# Patient Record
Sex: Female | Born: 1950
Health system: Southern US, Community
[De-identification: ages and names within clinical notes are randomized; demographics above are authoritative.]

## PROBLEM LIST (undated history)

## (undated) HISTORY — PX: FOOT SURGERY: SHX648

## (undated) HISTORY — PX: TONSILLECTOMY: SUR1361

## (undated) HISTORY — PX: APPENDECTOMY: SHX54

---

## 2002-12-05 ENCOUNTER — Other Ambulatory Visit: Admission: RE | Admit: 2002-12-05 | Discharge: 2002-12-05 | Payer: Self-pay | Admitting: Obstetrics and Gynecology

## 2004-07-14 ENCOUNTER — Other Ambulatory Visit: Admission: RE | Admit: 2004-07-14 | Discharge: 2004-07-14 | Payer: Self-pay | Admitting: Obstetrics and Gynecology

## 2009-08-16 ENCOUNTER — Ambulatory Visit: Payer: Self-pay | Admitting: Diagnostic Radiology

## 2009-08-16 ENCOUNTER — Ambulatory Visit (HOSPITAL_BASED_OUTPATIENT_CLINIC_OR_DEPARTMENT_OTHER): Admission: RE | Admit: 2009-08-16 | Discharge: 2009-08-16 | Payer: Self-pay | Admitting: Obstetrics and Gynecology

## 2011-02-25 ENCOUNTER — Other Ambulatory Visit (HOSPITAL_BASED_OUTPATIENT_CLINIC_OR_DEPARTMENT_OTHER): Payer: Self-pay | Admitting: Obstetrics and Gynecology

## 2011-02-25 ENCOUNTER — Other Ambulatory Visit (HOSPITAL_COMMUNITY): Payer: Self-pay | Admitting: Obstetrics and Gynecology

## 2011-02-25 DIAGNOSIS — Z1231 Encounter for screening mammogram for malignant neoplasm of breast: Secondary | ICD-10-CM

## 2011-02-26 ENCOUNTER — Ambulatory Visit (HOSPITAL_BASED_OUTPATIENT_CLINIC_OR_DEPARTMENT_OTHER)
Admission: RE | Admit: 2011-02-26 | Discharge: 2011-02-26 | Disposition: A | Discharge status: Home / Self Care | Payer: BC Managed Care – PPO | Source: Ambulatory Visit | Attending: Obstetrics and Gynecology | Admitting: Obstetrics and Gynecology

## 2011-02-26 DIAGNOSIS — Z1231 Encounter for screening mammogram for malignant neoplasm of breast: Secondary | ICD-10-CM | POA: Insufficient documentation

## 2012-01-20 ENCOUNTER — Encounter (INDEPENDENT_AMBULATORY_CARE_PROVIDER_SITE_OTHER): Payer: BC Managed Care – PPO | Admitting: Ophthalmology

## 2012-01-20 DIAGNOSIS — H33309 Unspecified retinal break, unspecified eye: Secondary | ICD-10-CM

## 2012-01-20 DIAGNOSIS — H2510 Age-related nuclear cataract, unspecified eye: Secondary | ICD-10-CM

## 2012-01-20 DIAGNOSIS — H43819 Vitreous degeneration, unspecified eye: Secondary | ICD-10-CM

## 2012-02-03 ENCOUNTER — Ambulatory Visit (INDEPENDENT_AMBULATORY_CARE_PROVIDER_SITE_OTHER): Payer: BC Managed Care – PPO | Admitting: Ophthalmology

## 2012-02-03 DIAGNOSIS — H33309 Unspecified retinal break, unspecified eye: Secondary | ICD-10-CM

## 2012-02-03 DIAGNOSIS — H40019 Open angle with borderline findings, low risk, unspecified eye: Secondary | ICD-10-CM

## 2012-03-25 ENCOUNTER — Other Ambulatory Visit (HOSPITAL_BASED_OUTPATIENT_CLINIC_OR_DEPARTMENT_OTHER): Payer: Self-pay | Admitting: Obstetrics and Gynecology

## 2012-03-25 DIAGNOSIS — Z1231 Encounter for screening mammogram for malignant neoplasm of breast: Secondary | ICD-10-CM

## 2012-04-04 ENCOUNTER — Ambulatory Visit (HOSPITAL_BASED_OUTPATIENT_CLINIC_OR_DEPARTMENT_OTHER)
Admission: RE | Admit: 2012-04-04 | Discharge: 2012-04-04 | Disposition: A | Discharge status: Home / Self Care | Payer: BC Managed Care – PPO | Source: Ambulatory Visit | Attending: Obstetrics and Gynecology | Admitting: Obstetrics and Gynecology

## 2012-04-04 DIAGNOSIS — Z1231 Encounter for screening mammogram for malignant neoplasm of breast: Secondary | ICD-10-CM | POA: Insufficient documentation

## 2012-06-08 ENCOUNTER — Ambulatory Visit (INDEPENDENT_AMBULATORY_CARE_PROVIDER_SITE_OTHER): Payer: BC Managed Care – PPO | Admitting: Ophthalmology

## 2012-06-29 ENCOUNTER — Ambulatory Visit (INDEPENDENT_AMBULATORY_CARE_PROVIDER_SITE_OTHER): Payer: BC Managed Care – PPO | Admitting: Ophthalmology

## 2012-07-25 ENCOUNTER — Ambulatory Visit (INDEPENDENT_AMBULATORY_CARE_PROVIDER_SITE_OTHER): Payer: BC Managed Care – PPO | Admitting: Ophthalmology

## 2012-07-25 DIAGNOSIS — H2510 Age-related nuclear cataract, unspecified eye: Secondary | ICD-10-CM

## 2012-07-25 DIAGNOSIS — H33309 Unspecified retinal break, unspecified eye: Secondary | ICD-10-CM

## 2012-07-25 DIAGNOSIS — H43819 Vitreous degeneration, unspecified eye: Secondary | ICD-10-CM

## 2012-09-15 ENCOUNTER — Encounter (HOSPITAL_BASED_OUTPATIENT_CLINIC_OR_DEPARTMENT_OTHER): Payer: Self-pay | Admitting: *Deleted

## 2012-09-15 ENCOUNTER — Emergency Department (HOSPITAL_BASED_OUTPATIENT_CLINIC_OR_DEPARTMENT_OTHER): Payer: BC Managed Care – PPO

## 2012-09-15 ENCOUNTER — Emergency Department (HOSPITAL_BASED_OUTPATIENT_CLINIC_OR_DEPARTMENT_OTHER)
Admission: EM | Admit: 2012-09-15 | Discharge: 2012-09-15 | Disposition: A | Discharge status: Home / Self Care | Payer: BC Managed Care – PPO | Attending: Emergency Medicine | Admitting: Emergency Medicine

## 2012-09-15 DIAGNOSIS — M25561 Pain in right knee: Secondary | ICD-10-CM

## 2012-09-15 DIAGNOSIS — Y939 Activity, unspecified: Secondary | ICD-10-CM | POA: Insufficient documentation

## 2012-09-15 DIAGNOSIS — S8990XA Unspecified injury of unspecified lower leg, initial encounter: Secondary | ICD-10-CM | POA: Insufficient documentation

## 2012-09-15 DIAGNOSIS — Y929 Unspecified place or not applicable: Secondary | ICD-10-CM | POA: Insufficient documentation

## 2012-09-15 DIAGNOSIS — X500XXA Overexertion from strenuous movement or load, initial encounter: Secondary | ICD-10-CM | POA: Insufficient documentation

## 2012-09-15 DIAGNOSIS — S99919A Unspecified injury of unspecified ankle, initial encounter: Secondary | ICD-10-CM | POA: Insufficient documentation

## 2012-09-15 MED ORDER — HYDROMORPHONE HCL PF 1 MG/ML IJ SOLN: 1.0000 mg | Freq: Once | INTRAMUSCULAR | Status: DC

## 2012-09-15 MED ORDER — ONDANSETRON HCL 4 MG/2ML IJ SOLN: 4.0000 mg | Freq: Once | INTRAMUSCULAR | Status: DC

## 2012-09-15 NOTE — ED Provider Notes (Signed)
Medical screening examination/treatment/procedure(s) were performed by non-physician practitioner and as supervising physician I was immediately available for consultation/collaboration.   Anyelina Claycomb B. Celina Shiley, MD 09/15/12 1515 

## 2012-09-15 NOTE — ED Notes (Signed)
She thinks she dislocated her right knee. Hx of same several times over the past year.

## 2012-09-15 NOTE — ED Notes (Signed)
Pt ambulatory- declined pain medication and knee support- no rx given

## 2012-09-15 NOTE — ED Provider Notes (Signed)
History     CSN: [id number]  Arrival date & time 09/15/12  1338   First MD Initiated Contact with Patient 09/15/12 1348      Chief Complaint  Patient presents with  . Knee Pain    (Consider location/radiation/quality/duration/timing/severity/associated sxs/prior treatment) HPI Comments: Pt states that she has a history of a ligament popping out of place in her right knee and she can't straighten it when it happens:pt states that it popped out when she was doing piliates today:pt states that when it pops back in she is usually fine:pt denies swelling   Patient is a 62 y.o. female presenting with knee pain. The history is provided by the patient. No language interpreter was used.  Knee Pain Location:  Knee Injury: yes   Knee location:  R knee Pain details:    Quality:  Aching   Severity:  Severe   Onset quality:  Sudden   Timing:  Constant   History reviewed. No pertinent past medical history.  Past Surgical History  Procedure Laterality Date  . Foot surgery      No family history on file.  History  Substance Use Topics  . Smoking status: Never Smoker   . Smokeless tobacco: Not on file  . Alcohol Use: No    OB History   Grav Para Term Preterm Abortions TAB SAB Ect Mult Living                  Review of Systems  Constitutional: Negative.   Respiratory: Negative.   Cardiovascular: Negative.     Allergies  Codeine  Home Medications  No current outpatient prescriptions on file.  BP 150/73  Pulse 63  Temp(Src) 97.6 F (36.4 C) (Oral)  Resp 22  Wt 120 lb (54.432 kg)  SpO2 100%  Physical Exam  Nursing note and vitals reviewed. Constitutional: She is oriented to person, place, and time. She appears well-nourished.  HENT:  Head: Normocephalic and atraumatic.  Cardiovascular: Normal rate and regular rhythm.   Pulmonary/Chest: Effort normal and breath sounds normal.  Musculoskeletal:  Pt is unable to fully straighten the right knee:no gross deformity  noted  Neurological: She is alert and oriented to person, place, and time.  Skin: Skin is warm and dry.  Psychiatric: She has a normal mood and affect.    ED Course  Procedures (including critical care time)  Labs Reviewed - No data to display No results found.   1. Knee pain, right       MDM  Pt relocated her knee on her own and is able to bend and flex knee without any problem:pt is refusing x-ray or immobilization:pt ambulation without any problem        Teressa Lower, NP 09/15/12 1414

## 2012-12-27 ENCOUNTER — Encounter: Payer: Self-pay | Admitting: Gastroenterology

## 2013-01-25 ENCOUNTER — Encounter: Payer: Self-pay | Admitting: Gastroenterology

## 2013-01-25 ENCOUNTER — Ambulatory Visit (INDEPENDENT_AMBULATORY_CARE_PROVIDER_SITE_OTHER): Payer: BC Managed Care – PPO | Admitting: Gastroenterology

## 2013-01-25 VITALS — BP 110/70 | HR 60 | Ht 62.0 in | Wt 120.2 lb

## 2013-01-25 DIAGNOSIS — K5900 Constipation, unspecified: Secondary | ICD-10-CM

## 2013-01-25 DIAGNOSIS — R195 Other fecal abnormalities: Secondary | ICD-10-CM

## 2013-01-25 MED ORDER — MOVIPREP 100 G PO SOLR: 1.0000 | Freq: Once | ORAL | Status: DC

## 2013-01-25 NOTE — Patient Instructions (Addendum)
Please start taking citrucel (orange flavored) powder fiber supplement.  This may cause some bloating at first but that usually goes away. Begin with a small spoonful and work your way up to a large, heaping spoonful daily over a week. You will be set up for a colonoscopy for constipation, hemocult positive stool (LEC, moderate sedation).                                                We are excited to introduce MyChart, a new best-in-class service that provides you online access to important information in your electronic medical record. We want to make it easier for you to view your health information - all in one secure location - when and where you need it. We expect MyChart will enhance the quality of care and service we provide.  When you register for MyChart, you can:    View your test results.    Request appointments and receive appointment reminders via email.    Request medication renewals.    View your medical history, allergies, medications and immunizations.    Communicate with your physician's office through a password-protected site.    Conveniently print information such as your medication lists.  To find out if MyChart is right for you, please talk to a member of our clinical staff today. We will gladly answer your questions about this free health and wellness tool.  If you are age 60 or older and want a member of your family to have access to your record, you must provide written consent by completing a proxy form available at our office. Please speak to our clinical staff about guidelines regarding accounts for patients younger than age 7.  As you activate your MyChart account and need any technical assistance, please call the MyChart technical support line at [phone]) or email your question to [email or web address]. If you email your question(s), please include your name, a return phone number and the best time to reach you.  If you have  non-urgent health-related questions, you can send a message to our office through MyChart at [email or web address]. If you have a medical emergency, call 911.  Thank you for using MyChart as your new health and wellness resource!   MyChart licensed from [employer],  4401-0272. Patents Pending.

## 2013-01-25 NOTE — Progress Notes (Signed)
  HPI: This is a  very pleasant 62 year old woman whom I am meeting for the first time today.  Microscopic blood in stool on home kit for fobt.  This was done at her primary care office as part of a routine physical  She has constipation, only once has she seen blood in stool. This has been a problem all her life. She takes miralax, prunes.  Stay hydrated.  Has never had a colonoscopy.  A friend's husband had perforation from colonoscopy.     Review of systems: Pertinent positive and negative review of systems were noted in the above HPI section. Complete review of systems was performed and was otherwise normal.    History reviewed. No pertinent past medical history.  Past Surgical History  Procedure Laterality Date  . Foot surgery Bilateral   . Tonsillectomy      age 70  . Appendectomy      age 41    Current Outpatient Prescriptions  Medication Sig Dispense Refill  . estradiol (ESTRACE) 0.5 MG tablet Take 0.5 mg by mouth every other day.      . fish oil-omega-3 fatty acids 1000 MG capsule Take 2 g by mouth daily.      Marland Kitchen ibuprofen (ADVIL,MOTRIN) 200 MG tablet Take 400 mg by mouth as needed for pain.      Marland Kitchen MULTIPLE VITAMIN PO Take 1 tablet by mouth daily.      . Polyethylene Glycol 3350 (MIRALAX PO) Take by mouth daily.       No current facility-administered medications for this visit.    Allergies as of 01/25/2013 - Review Complete 01/25/2013  Allergen Reaction Noted  . Codeine  09/15/2012    Family History  Problem Relation Age of Onset  . Colon cancer Neg Hx   . Congestive Heart Failure Father   . Other Mother     brain tumors-benign    History   Social History  . Marital Status: Married    Spouse Name: N/A    Number of Children: 2  . Years of Education: N/A   Occupational History  . water aerobics teacher    Social History Main Topics  . Smoking status: Never Smoker   . Smokeless tobacco: Never Used  . Alcohol Use: No  . Drug Use: No  . Sexually  Active: Not on file   Other Topics Concern  . Not on file   Social History Narrative  . No narrative on file       Physical Exam: BP 110/70  Pulse 60  Ht 5\' 2"  (1.575 m)  Wt 120 lb 3.2 oz (54.522 kg)  BMI 21.98 kg/m2 Constitutional: generally well-appearing Psychiatric: alert and oriented x3 Eyes: extraocular movements intact Mouth: oral pharynx moist, no lesions Neck: supple no lymphadenopathy Cardiovascular: heart regular rate and rhythm Lungs: clear to auscultation bilaterally Abdomen: soft, nontender, nondistended, no obvious ascites, no peritoneal signs, normal bowel sounds Extremities: no lower extremity edema bilaterally Skin: no lesions on visible extremities    Assessment and plan: 62 y.o. female with  Hemoccult-positive stool, chronic constipation  She has had lifelong constipation. I recommended she add fiber supplements to her daily MiraLax routine. I reassured her that her Hemoccult-positive stool is unlikely to be anything serious we should proceed with colonoscopy at her soonest convenience to be more certain.

## 2013-02-07 ENCOUNTER — Ambulatory Visit (AMBULATORY_SURGERY_CENTER): Payer: BC Managed Care – PPO | Admitting: Gastroenterology

## 2013-02-07 ENCOUNTER — Encounter: Payer: Self-pay | Admitting: Gastroenterology

## 2013-02-07 VITALS — BP 104/66 | HR 62 | Temp 98.3°F | Resp 23 | Ht 62.0 in | Wt 120.0 lb

## 2013-02-07 DIAGNOSIS — D126 Benign neoplasm of colon, unspecified: Secondary | ICD-10-CM

## 2013-02-07 DIAGNOSIS — K5900 Constipation, unspecified: Secondary | ICD-10-CM

## 2013-02-07 MED ORDER — SODIUM CHLORIDE 0.9 % IV SOLN: 500.0000 mL | INTRAVENOUS | Status: DC

## 2013-02-07 NOTE — Patient Instructions (Addendum)
YOU HAD AN ENDOSCOPIC PROCEDURE TODAY AT THE [facility]: Refer to the procedure report that was given to you for any specific questions about what was found during the examination.  If the procedure report does not answer your questions, please call your gastroenterologist to clarify.  If you requested that your care partner not be given the details of your procedure findings, then the procedure report has been included in a sealed envelope for you to review at your convenience later.  YOU SHOULD EXPECT: Some feelings of bloating in the abdomen. Passage of more gas than usual.  Walking can help get rid of the air that was put into your GI tract during the procedure and reduce the bloating. If you had a lower endoscopy (such as a colonoscopy or flexible sigmoidoscopy) you may notice spotting of blood in your stool or on the toilet paper. If you underwent a bowel prep for your procedure, then you may not have a normal bowel movement for a few days.  DIET: Your first meal following the procedure should be a light meal and then it is ok to progress to your normal diet.  A half-sandwich or bowl of soup is an example of a good first meal.  Heavy or fried foods are harder to digest and may make you feel nauseous or bloated.  Likewise meals heavy in dairy and vegetables can cause extra gas to form and this can also increase the bloating.  Drink plenty of fluids but you should avoid alcoholic beverages for 24 hours.  ACTIVITY: Your care partner should take you home directly after the procedure.  You should plan to take it easy, moving slowly for the rest of the day.  You can resume normal activity the day after the procedure however you should NOT DRIVE or use heavy machinery for 24 hours (because of the sedation medicines used during the test).    SYMPTOMS TO REPORT IMMEDIATELY: A gastroenterologist can be reached at any hour.  During normal business hours, 8:30 AM to 5:00 PM Monday through Friday,  call [phone].  After hours and on weekends, please call the GI answering service at [phone] emergency number who will take a message and have the physician on call contact you.   Following lower endoscopy (colonoscopy or flexible sigmoidoscopy):  Excessive amounts of blood in the stool  Significant tenderness or worsening of abdominal pains  Swelling of the abdomen that is new, acute  Fever of 100F or higher  FOLLOW UP: If any biopsies were taken you will be contacted by phone or by letter within the next 1-3 weeks.  Call your gastroenterologist if you have not heard about the biopsies in 3 weeks.  Our staff will call the home number listed on your records the next business day following your procedure to check on you and address any questions or concerns that you may have at that time regarding the information given to you following your procedure. This is a courtesy call and so if there is no answer at the home number and we have not heard from you through the emergency physician on call, we will assume that you have returned to your regular daily activities without incident.  SIGNATURES/CONFIDENTIALITY: You and/or your care partner have signed paperwork which will be entered into your electronic medical record.  These signatures attest to the fact that that the information above on your After Visit Summary has been reviewed and is understood.  Full responsibility of the confidentiality   of this discharge information lies with you and/or your care-partner.  Handout on polyps  

## 2013-02-07 NOTE — Progress Notes (Signed)
Patient did not experience any of the following events: a burn prior to discharge; a fall within the facility; wrong site/side/patient/procedure/implant event; or a hospital transfer or hospital admission upon discharge from the facility. (G8907)Patient did not have preoperative order for IV antibiotic SSI prophylaxis. (G8918) ewm 

## 2013-02-07 NOTE — Op Note (Signed)
[facility] [address] [place], [address]   COLONOSCOPY PROCEDURE REPORT  PATIENT: Kayla [name], Kayla [name]  MR#: [id number] BIRTHDATE: September 22, 1950 , 61  yrs. old GENDER: Female ENDOSCOPIST: Rachael Fee, MD REFERRED XB:JYNWG Strickland, M.D. PROCEDURE DATE:  02/07/2013 PROCEDURE:   Colonoscopy with snare polypectomy ASA CLASS:   Class II INDICATIONS:constipation, FOBT + stool. MEDICATIONS: Fentanyl 50 mcg IV, Versed 4 mg IV, and These medications were titrated to patient response per physician's verbal order  DESCRIPTION OF PROCEDURE:   After the risks benefits and alternatives of the procedure were thoroughly explained, informed consent was obtained.  A digital rectal exam revealed no abnormalities of the rectum.   The LB [id number]  endoscope was introduced through the anus and advanced to the cecum, which was identified by both the appendix and ileocecal valve. No adverse events experienced.   The quality of the prep was good.  The instrument was then slowly withdrawn as the colon was fully examined.   COLON FINDINGS: One polyp was found, removed and sent to pathology. This was 2-58mm across, sessile, located in descending segment, removed with cold snare.  The examination was otherwise normal. Retroflexed views revealed no abnormalities. The time to cecum=5 minutes 12 seconds.  Withdrawal time=8 minutes 56 seconds.  The scope was withdrawn and the procedure completed. COMPLICATIONS: There were no complications.  ENDOSCOPIC IMPRESSION: One polyp was found, removed and sent to pathology. The examination was otherwise normal.  RECOMMENDATIONS: If the polyp(s) removed today are proven to be adenomatous (pre-cancerous) polyps, you will need a repeat colonoscopy in 5 years.  Otherwise you should continue to follow colorectal cancer screening guidelines for "routine risk" patients with colonoscopy in 10 years.  You will receive a letter within 1-2 weeks  with the results of your biopsy as well as final recommendations.  Please call my office if you have not received a letter after 3 weeks.   eSigned:  Rachael Fee, MD 02/07/2013 8:41 AM

## 2013-02-07 NOTE — Progress Notes (Signed)
Pt's sats droppped to 87%.  Pt encourged to take  Deep breaths.  O2 bumped up to 4 liters [place].  sats increased to 975. Maw

## 2013-02-08 ENCOUNTER — Telehealth: Payer: Self-pay

## 2013-02-08 NOTE — Telephone Encounter (Signed)
  Follow up Call-  Call back number 02/07/2013  Post procedure Call Back phone  # [phone]  Permission to leave phone message Yes     Patient questions:  Do you have a fever, pain , or abdominal swelling? no Pain Score  0 *  Have you tolerated food without any problems? yes  Have you been able to return to your normal activities? yes  Do you have any questions about your discharge instructions: Diet   no Medications  no Follow up visit  no  Do you have questions or concerns about your Care? no  Actions: * If pain score is 4 or above: No action needed, pain <4.

## 2013-02-16 ENCOUNTER — Encounter: Payer: Self-pay | Admitting: Gastroenterology

## 2013-04-27 ENCOUNTER — Other Ambulatory Visit: Payer: Self-pay

## 2013-10-09 ENCOUNTER — Other Ambulatory Visit (HOSPITAL_BASED_OUTPATIENT_CLINIC_OR_DEPARTMENT_OTHER): Payer: Self-pay | Admitting: Obstetrics and Gynecology

## 2013-10-09 DIAGNOSIS — Z1231 Encounter for screening mammogram for malignant neoplasm of breast: Secondary | ICD-10-CM

## 2013-10-13 ENCOUNTER — Ambulatory Visit (HOSPITAL_BASED_OUTPATIENT_CLINIC_OR_DEPARTMENT_OTHER)
Admission: RE | Admit: 2013-10-13 | Discharge: 2013-10-13 | Disposition: A | Discharge status: Home / Self Care | Payer: BC Managed Care – PPO | Source: Ambulatory Visit | Attending: Obstetrics and Gynecology | Admitting: Obstetrics and Gynecology

## 2013-10-13 DIAGNOSIS — Z1231 Encounter for screening mammogram for malignant neoplasm of breast: Secondary | ICD-10-CM | POA: Insufficient documentation

## 2014-03-10 DIAGNOSIS — N951 Menopausal and female climacteric states: Secondary | ICD-10-CM | POA: Insufficient documentation

## 2014-11-26 ENCOUNTER — Other Ambulatory Visit (HOSPITAL_BASED_OUTPATIENT_CLINIC_OR_DEPARTMENT_OTHER): Payer: Self-pay | Admitting: Obstetrics and Gynecology

## 2014-11-26 DIAGNOSIS — Z1231 Encounter for screening mammogram for malignant neoplasm of breast: Secondary | ICD-10-CM

## 2014-11-27 ENCOUNTER — Ambulatory Visit (HOSPITAL_BASED_OUTPATIENT_CLINIC_OR_DEPARTMENT_OTHER)
Admission: RE | Admit: 2014-11-27 | Discharge: 2014-11-27 | Disposition: A | Discharge status: Home / Self Care | Payer: BLUE CROSS/BLUE SHIELD | Source: Ambulatory Visit | Attending: Obstetrics and Gynecology | Admitting: Obstetrics and Gynecology

## 2014-11-27 DIAGNOSIS — Z1231 Encounter for screening mammogram for malignant neoplasm of breast: Secondary | ICD-10-CM | POA: Insufficient documentation

## 2014-12-03 ENCOUNTER — Encounter (INDEPENDENT_AMBULATORY_CARE_PROVIDER_SITE_OTHER): Payer: BLUE CROSS/BLUE SHIELD | Admitting: Ophthalmology

## 2014-12-03 DIAGNOSIS — H33302 Unspecified retinal break, left eye: Secondary | ICD-10-CM

## 2014-12-03 DIAGNOSIS — H2512 Age-related nuclear cataract, left eye: Secondary | ICD-10-CM | POA: Diagnosis not present

## 2014-12-03 DIAGNOSIS — H43813 Vitreous degeneration, bilateral: Secondary | ICD-10-CM | POA: Diagnosis not present

## 2014-12-03 DIAGNOSIS — H34812 Central retinal vein occlusion, left eye: Secondary | ICD-10-CM

## 2014-12-17 ENCOUNTER — Encounter (INDEPENDENT_AMBULATORY_CARE_PROVIDER_SITE_OTHER): Payer: BLUE CROSS/BLUE SHIELD | Admitting: Ophthalmology

## 2014-12-17 DIAGNOSIS — H34812 Central retinal vein occlusion, left eye: Secondary | ICD-10-CM | POA: Diagnosis not present

## 2014-12-17 DIAGNOSIS — H33302 Unspecified retinal break, left eye: Secondary | ICD-10-CM | POA: Diagnosis not present

## 2014-12-17 DIAGNOSIS — H43813 Vitreous degeneration, bilateral: Secondary | ICD-10-CM

## 2014-12-26 ENCOUNTER — Ambulatory Visit (INDEPENDENT_AMBULATORY_CARE_PROVIDER_SITE_OTHER): Payer: BLUE CROSS/BLUE SHIELD

## 2014-12-26 ENCOUNTER — Ambulatory Visit: Payer: BLUE CROSS/BLUE SHIELD

## 2014-12-26 ENCOUNTER — Ambulatory Visit (INDEPENDENT_AMBULATORY_CARE_PROVIDER_SITE_OTHER): Payer: BLUE CROSS/BLUE SHIELD | Admitting: Podiatry

## 2014-12-26 VITALS — BP 137/72 | HR 70 | Resp 15

## 2014-12-26 DIAGNOSIS — M21611 Bunion of right foot: Secondary | ICD-10-CM

## 2014-12-26 DIAGNOSIS — M2012 Hallux valgus (acquired), left foot: Secondary | ICD-10-CM

## 2014-12-26 DIAGNOSIS — M2011 Hallux valgus (acquired), right foot: Secondary | ICD-10-CM

## 2014-12-26 DIAGNOSIS — M779 Enthesopathy, unspecified: Secondary | ICD-10-CM

## 2014-12-26 MED ORDER — TRIAMCINOLONE ACETONIDE 10 MG/ML IJ SUSP
10.0000 mg | Freq: Once | INTRAMUSCULAR | Status: AC
  Administered 2014-12-26: 10 mg

## 2014-12-26 NOTE — Progress Notes (Signed)
   Subjective:    Patient ID: Kayla [name], female    DOB: 04/28/1951, 64 y.o.   MRN: [id number]  HPI Patient present with a bunion on right foot, medial side and pain has gotten worse. On the tip of the 4th toe on the right foot, pt has no feeling. This has been going on for approximately the past 10 years from previous bunionectomy surgery. Pt was diagnosed in April 2016 from Dr. Burt Ek from the city of [place] with having plantar fasciitis. Pt is still having heel pain and stated "it feels bruised". Pt has been soaking foot in hot tub with relief. Pt has also been taking ibuprofen with relief.   Review of Systems  Eyes: Positive for visual disturbance.  Musculoskeletal: Positive for myalgias.  All other systems reviewed and are negative.      Objective:   Physical Exam        Assessment & Plan:

## 2014-12-28 NOTE — Progress Notes (Signed)
Subjective:     Patient ID: Kayla [name], female   DOB: Mar 22, 1951, 64 y.o.   MRN: [id number]  HPI patient presents stating that I had surgery by you around 10 years ago and I'm getting some discomfort on the medial side of my right first metatarsal and my fourth toe and also my heel over the last 6 months   Review of Systems  All other systems reviewed and are negative.      Objective:   Physical Exam  Constitutional: She is oriented to person, place, and time.  Cardiovascular: Intact distal pulses.   Musculoskeletal: Normal range of motion.  Neurological: She is oriented to person, place, and time.  Skin: Skin is warm.  Nursing note and vitals reviewed.  neurovascular status intact muscle strength adequate with range of motion of the subtalar midtarsal joint within normal limits. Patient has quite a bit of forefoot flexibility with depression of the arch upon weightbearing and is noted to have well-healed surgical scars first metatarsal right with excellent range of motion of the first MPJ bilateral. I did note that the patient has inflammation in the plantar medial portion of the first metatarsal with fluid buildup and also moderate discomfort in the plantar heel and arch right     Assessment:     Appears to be more inflammatory with inflamed capsule and does not appear to be structural with significant depression of the arch as part of the pathological process    Plan:     H&P and x-rays reviewed and careful medial injection administered 3 mg Kenalog 5 mg Xylocaine first MPJ. I then went ahead and I scanned for [facility] type orthotic to give lift into the arch and provide for proper heel cup and reduction of pressure. Patient will be seen back when those are returned

## 2015-01-08 ENCOUNTER — Encounter (INDEPENDENT_AMBULATORY_CARE_PROVIDER_SITE_OTHER): Payer: BLUE CROSS/BLUE SHIELD | Admitting: Ophthalmology

## 2015-01-08 DIAGNOSIS — H43813 Vitreous degeneration, bilateral: Secondary | ICD-10-CM | POA: Diagnosis not present

## 2015-01-08 DIAGNOSIS — H34812 Central retinal vein occlusion, left eye: Secondary | ICD-10-CM

## 2015-01-08 DIAGNOSIS — H33302 Unspecified retinal break, left eye: Secondary | ICD-10-CM

## 2015-01-15 ENCOUNTER — Encounter (INDEPENDENT_AMBULATORY_CARE_PROVIDER_SITE_OTHER): Payer: BLUE CROSS/BLUE SHIELD | Admitting: Ophthalmology

## 2015-01-15 DIAGNOSIS — H34831 Tributary (branch) retinal vein occlusion, right eye: Secondary | ICD-10-CM | POA: Diagnosis not present

## 2015-01-15 DIAGNOSIS — H33303 Unspecified retinal break, bilateral: Secondary | ICD-10-CM

## 2015-01-15 DIAGNOSIS — H43813 Vitreous degeneration, bilateral: Secondary | ICD-10-CM

## 2015-01-18 ENCOUNTER — Ambulatory Visit: Payer: BLUE CROSS/BLUE SHIELD | Admitting: *Deleted

## 2015-01-18 DIAGNOSIS — M779 Enthesopathy, unspecified: Secondary | ICD-10-CM

## 2015-01-18 NOTE — Patient Instructions (Signed)

## 2015-01-18 NOTE — Progress Notes (Signed)
Patient ID: Kayla [name], female   DOB: 21-Jan-1951, 64 y.o.   MRN: [id number] Patient presents for orthotic pick up.  Verbal and written break in and wear instructions given.  Patient will follow up in 4 weeks if symptoms worsen or fail to improve.

## 2015-02-01 ENCOUNTER — Telehealth: Payer: Self-pay | Admitting: *Deleted

## 2015-02-01 NOTE — Telephone Encounter (Signed)
Pt states she received her orthotic 2 weeks ago, and they seem to be cutting in to the arches of the feet especially the left.

## 2015-02-01 NOTE — Telephone Encounter (Signed)
Left message for patient to call me back regarding issues on 01/30/15.  Will call her again

## 2015-02-04 DIAGNOSIS — H348122 Central retinal vein occlusion, left eye, stable: Secondary | ICD-10-CM | POA: Insufficient documentation

## 2015-02-05 ENCOUNTER — Encounter (INDEPENDENT_AMBULATORY_CARE_PROVIDER_SITE_OTHER): Payer: BLUE CROSS/BLUE SHIELD | Admitting: Ophthalmology

## 2015-02-05 DIAGNOSIS — H34812 Central retinal vein occlusion, left eye: Secondary | ICD-10-CM

## 2015-02-05 DIAGNOSIS — H43813 Vitreous degeneration, bilateral: Secondary | ICD-10-CM

## 2015-02-05 DIAGNOSIS — H33302 Unspecified retinal break, left eye: Secondary | ICD-10-CM

## 2015-02-28 ENCOUNTER — Telehealth: Payer: Self-pay | Admitting: *Deleted

## 2015-02-28 NOTE — Telephone Encounter (Signed)
Pt states she was here 3.5 weeks ago and her orthotics were painful and were sent back for changes.  She is calling to check the status of the repairs.

## 2015-03-04 ENCOUNTER — Encounter (INDEPENDENT_AMBULATORY_CARE_PROVIDER_SITE_OTHER): Payer: BLUE CROSS/BLUE SHIELD | Admitting: Ophthalmology

## 2015-03-04 DIAGNOSIS — H34812 Central retinal vein occlusion, left eye: Secondary | ICD-10-CM | POA: Diagnosis not present

## 2015-03-04 DIAGNOSIS — H43813 Vitreous degeneration, bilateral: Secondary | ICD-10-CM | POA: Diagnosis not present

## 2015-03-04 DIAGNOSIS — H2512 Age-related nuclear cataract, left eye: Secondary | ICD-10-CM | POA: Diagnosis not present

## 2015-03-04 DIAGNOSIS — H33302 Unspecified retinal break, left eye: Secondary | ICD-10-CM

## 2015-03-08 ENCOUNTER — Ambulatory Visit: Payer: BLUE CROSS/BLUE SHIELD | Admitting: *Deleted

## 2015-03-08 DIAGNOSIS — M779 Enthesopathy, unspecified: Secondary | ICD-10-CM

## 2015-03-08 NOTE — Progress Notes (Signed)
Patient ID: Kayla [name], female   DOB: [birth year], 64 y.o.   MRN: [id number] Pick up orthotics.  Orthotics are tried on and are still cutting into arch will send back for further adjustment.

## 2015-03-08 NOTE — Patient Instructions (Signed)

## 2015-04-01 ENCOUNTER — Encounter (INDEPENDENT_AMBULATORY_CARE_PROVIDER_SITE_OTHER): Payer: BLUE CROSS/BLUE SHIELD | Admitting: Ophthalmology

## 2015-04-03 ENCOUNTER — Encounter (INDEPENDENT_AMBULATORY_CARE_PROVIDER_SITE_OTHER): Payer: BLUE CROSS/BLUE SHIELD | Admitting: Ophthalmology

## 2015-04-03 DIAGNOSIS — H348321 Tributary (branch) retinal vein occlusion, left eye, with retinal neovascularization: Secondary | ICD-10-CM

## 2015-04-03 DIAGNOSIS — H33302 Unspecified retinal break, left eye: Secondary | ICD-10-CM | POA: Diagnosis not present

## 2015-04-03 DIAGNOSIS — H2512 Age-related nuclear cataract, left eye: Secondary | ICD-10-CM

## 2015-04-03 DIAGNOSIS — H3509 Other intraretinal microvascular abnormalities: Secondary | ICD-10-CM

## 2015-04-03 DIAGNOSIS — H43813 Vitreous degeneration, bilateral: Secondary | ICD-10-CM

## 2015-05-07 ENCOUNTER — Encounter (INDEPENDENT_AMBULATORY_CARE_PROVIDER_SITE_OTHER): Payer: BLUE CROSS/BLUE SHIELD | Admitting: Ophthalmology

## 2015-05-07 DIAGNOSIS — H33302 Unspecified retinal break, left eye: Secondary | ICD-10-CM | POA: Diagnosis not present

## 2015-05-07 DIAGNOSIS — H348321 Tributary (branch) retinal vein occlusion, left eye, with retinal neovascularization: Secondary | ICD-10-CM

## 2015-05-07 DIAGNOSIS — H43813 Vitreous degeneration, bilateral: Secondary | ICD-10-CM | POA: Diagnosis not present

## 2015-05-20 ENCOUNTER — Ambulatory Visit (INDEPENDENT_AMBULATORY_CARE_PROVIDER_SITE_OTHER): Payer: BLUE CROSS/BLUE SHIELD | Admitting: Podiatry

## 2015-05-20 ENCOUNTER — Encounter: Payer: Self-pay | Admitting: Podiatry

## 2015-05-20 VITALS — BP 133/84 | HR 77 | Resp 12

## 2015-05-20 DIAGNOSIS — M779 Enthesopathy, unspecified: Secondary | ICD-10-CM

## 2015-05-22 NOTE — Progress Notes (Signed)
Subjective:     Patient ID: Kayla [name], female   DOB: [birth year], 64 y.o.   MRN: [id number]  HPI patient states that she's continuing to have problems with her orthotic and giving her leg problems and foot problems more right over left   Review of Systems     Objective:   Physical Exam Neurovascular status intact muscle strength adequate with moderate depression of the arch and continued discomfort in the arch and lower leg right over left with negative Homans sign noted    Assessment:     Tendinitis-like condition that so far is not reducing like we have been helping    Plan:     Reviewed condition and at this time we are going to try to again to revamp the orthotics. Reappoint to recheck

## 2015-06-03 ENCOUNTER — Encounter (INDEPENDENT_AMBULATORY_CARE_PROVIDER_SITE_OTHER): Payer: BLUE CROSS/BLUE SHIELD | Admitting: Ophthalmology

## 2015-06-04 ENCOUNTER — Encounter (INDEPENDENT_AMBULATORY_CARE_PROVIDER_SITE_OTHER): Payer: BLUE CROSS/BLUE SHIELD | Admitting: Ophthalmology

## 2015-06-04 DIAGNOSIS — H43813 Vitreous degeneration, bilateral: Secondary | ICD-10-CM

## 2015-06-04 DIAGNOSIS — H2512 Age-related nuclear cataract, left eye: Secondary | ICD-10-CM | POA: Diagnosis not present

## 2015-06-04 DIAGNOSIS — H34832 Tributary (branch) retinal vein occlusion, left eye, with macular edema: Secondary | ICD-10-CM

## 2015-07-09 ENCOUNTER — Encounter (INDEPENDENT_AMBULATORY_CARE_PROVIDER_SITE_OTHER): Payer: BLUE CROSS/BLUE SHIELD | Admitting: Ophthalmology

## 2015-07-09 DIAGNOSIS — H34832 Tributary (branch) retinal vein occlusion, left eye, with macular edema: Secondary | ICD-10-CM

## 2015-07-09 DIAGNOSIS — H43813 Vitreous degeneration, bilateral: Secondary | ICD-10-CM

## 2015-08-20 ENCOUNTER — Encounter (INDEPENDENT_AMBULATORY_CARE_PROVIDER_SITE_OTHER): Payer: BLUE CROSS/BLUE SHIELD | Admitting: Ophthalmology

## 2015-08-20 ENCOUNTER — Ambulatory Visit: Payer: BLUE CROSS/BLUE SHIELD | Admitting: *Deleted

## 2015-08-20 DIAGNOSIS — H34832 Tributary (branch) retinal vein occlusion, left eye, with macular edema: Secondary | ICD-10-CM | POA: Diagnosis not present

## 2015-08-20 DIAGNOSIS — H43813 Vitreous degeneration, bilateral: Secondary | ICD-10-CM

## 2015-08-20 DIAGNOSIS — H2513 Age-related nuclear cataract, bilateral: Secondary | ICD-10-CM

## 2015-08-20 DIAGNOSIS — M779 Enthesopathy, unspecified: Secondary | ICD-10-CM

## 2015-08-26 NOTE — Progress Notes (Signed)
Patient ID: Kayla [name], female   DOB: September 06, 1950, 65 y.o.   MRN: [id number] Patient presents stating that she feels like her left foot is rolling off the orthotic.  We will send orthotic to manufacturer to have a lateral wedge added to try and stop the rolling off.  We will call when the orthotics returns

## 2015-10-15 ENCOUNTER — Encounter (INDEPENDENT_AMBULATORY_CARE_PROVIDER_SITE_OTHER): Payer: BLUE CROSS/BLUE SHIELD | Admitting: Ophthalmology

## 2015-10-15 DIAGNOSIS — H43813 Vitreous degeneration, bilateral: Secondary | ICD-10-CM

## 2015-10-15 DIAGNOSIS — H34832 Tributary (branch) retinal vein occlusion, left eye, with macular edema: Secondary | ICD-10-CM

## 2015-10-15 DIAGNOSIS — H2513 Age-related nuclear cataract, bilateral: Secondary | ICD-10-CM

## 2015-12-12 ENCOUNTER — Ambulatory Visit
Admission: RE | Admit: 2015-12-12 | Discharge: 2015-12-12 | Disposition: A | Discharge status: Home / Self Care | Payer: BLUE CROSS/BLUE SHIELD | Source: Ambulatory Visit | Attending: Family Medicine | Admitting: Family Medicine

## 2015-12-12 ENCOUNTER — Other Ambulatory Visit: Payer: Self-pay | Admitting: Family Medicine

## 2015-12-12 DIAGNOSIS — R938 Abnormal findings on diagnostic imaging of other specified body structures: Secondary | ICD-10-CM | POA: Insufficient documentation

## 2015-12-12 DIAGNOSIS — T148XXA Other injury of unspecified body region, initial encounter: Secondary | ICD-10-CM

## 2015-12-12 DIAGNOSIS — M7981 Nontraumatic hematoma of soft tissue: Secondary | ICD-10-CM | POA: Insufficient documentation

## 2015-12-19 ENCOUNTER — Encounter (INDEPENDENT_AMBULATORY_CARE_PROVIDER_SITE_OTHER): Payer: BLUE CROSS/BLUE SHIELD | Admitting: Ophthalmology

## 2015-12-19 DIAGNOSIS — H43813 Vitreous degeneration, bilateral: Secondary | ICD-10-CM | POA: Diagnosis not present

## 2015-12-19 DIAGNOSIS — H2513 Age-related nuclear cataract, bilateral: Secondary | ICD-10-CM | POA: Diagnosis not present

## 2015-12-19 DIAGNOSIS — H34832 Tributary (branch) retinal vein occlusion, left eye, with macular edema: Secondary | ICD-10-CM | POA: Diagnosis not present

## 2015-12-31 ENCOUNTER — Encounter (INDEPENDENT_AMBULATORY_CARE_PROVIDER_SITE_OTHER): Payer: BLUE CROSS/BLUE SHIELD | Admitting: Ophthalmology

## 2016-01-20 ENCOUNTER — Other Ambulatory Visit (HOSPITAL_BASED_OUTPATIENT_CLINIC_OR_DEPARTMENT_OTHER): Payer: Self-pay | Admitting: Obstetrics and Gynecology

## 2016-01-20 DIAGNOSIS — Z1231 Encounter for screening mammogram for malignant neoplasm of breast: Secondary | ICD-10-CM

## 2016-01-21 ENCOUNTER — Ambulatory Visit (HOSPITAL_BASED_OUTPATIENT_CLINIC_OR_DEPARTMENT_OTHER)
Admission: RE | Admit: 2016-01-21 | Discharge: 2016-01-21 | Disposition: A | Discharge status: Home / Self Care | Payer: BLUE CROSS/BLUE SHIELD | Source: Ambulatory Visit | Attending: Obstetrics and Gynecology | Admitting: Obstetrics and Gynecology

## 2016-01-21 DIAGNOSIS — Z1231 Encounter for screening mammogram for malignant neoplasm of breast: Secondary | ICD-10-CM | POA: Diagnosis not present

## 2016-03-04 ENCOUNTER — Encounter (INDEPENDENT_AMBULATORY_CARE_PROVIDER_SITE_OTHER): Payer: BLUE CROSS/BLUE SHIELD | Admitting: Ophthalmology

## 2016-03-04 DIAGNOSIS — H348322 Tributary (branch) retinal vein occlusion, left eye, stable: Secondary | ICD-10-CM

## 2016-03-04 DIAGNOSIS — H43813 Vitreous degeneration, bilateral: Secondary | ICD-10-CM

## 2016-04-29 ENCOUNTER — Encounter (INDEPENDENT_AMBULATORY_CARE_PROVIDER_SITE_OTHER): Payer: BLUE CROSS/BLUE SHIELD | Admitting: Ophthalmology

## 2016-04-29 DIAGNOSIS — H34832 Tributary (branch) retinal vein occlusion, left eye, with macular edema: Secondary | ICD-10-CM | POA: Diagnosis not present

## 2016-04-29 DIAGNOSIS — H43813 Vitreous degeneration, bilateral: Secondary | ICD-10-CM | POA: Diagnosis not present

## 2016-04-29 DIAGNOSIS — H2513 Age-related nuclear cataract, bilateral: Secondary | ICD-10-CM | POA: Diagnosis not present

## 2016-07-22 ENCOUNTER — Encounter (INDEPENDENT_AMBULATORY_CARE_PROVIDER_SITE_OTHER): Payer: BLUE CROSS/BLUE SHIELD | Admitting: Ophthalmology

## 2016-07-22 DIAGNOSIS — H43813 Vitreous degeneration, bilateral: Secondary | ICD-10-CM | POA: Diagnosis not present

## 2016-07-22 DIAGNOSIS — H2513 Age-related nuclear cataract, bilateral: Secondary | ICD-10-CM | POA: Diagnosis not present

## 2016-07-22 DIAGNOSIS — H348322 Tributary (branch) retinal vein occlusion, left eye, stable: Secondary | ICD-10-CM | POA: Diagnosis not present

## 2016-09-16 ENCOUNTER — Encounter (INDEPENDENT_AMBULATORY_CARE_PROVIDER_SITE_OTHER): Payer: BLUE CROSS/BLUE SHIELD | Admitting: Ophthalmology

## 2016-09-16 DIAGNOSIS — H34832 Tributary (branch) retinal vein occlusion, left eye, with macular edema: Secondary | ICD-10-CM | POA: Diagnosis not present

## 2016-09-16 DIAGNOSIS — H43813 Vitreous degeneration, bilateral: Secondary | ICD-10-CM | POA: Diagnosis not present

## 2016-09-16 DIAGNOSIS — H2513 Age-related nuclear cataract, bilateral: Secondary | ICD-10-CM

## 2016-11-11 ENCOUNTER — Encounter (INDEPENDENT_AMBULATORY_CARE_PROVIDER_SITE_OTHER): Payer: BLUE CROSS/BLUE SHIELD | Admitting: Ophthalmology

## 2016-11-11 DIAGNOSIS — H43813 Vitreous degeneration, bilateral: Secondary | ICD-10-CM | POA: Diagnosis not present

## 2016-11-11 DIAGNOSIS — H348322 Tributary (branch) retinal vein occlusion, left eye, stable: Secondary | ICD-10-CM | POA: Diagnosis not present

## 2016-12-03 DIAGNOSIS — N952 Postmenopausal atrophic vaginitis: Secondary | ICD-10-CM | POA: Insufficient documentation

## 2017-01-06 ENCOUNTER — Encounter (INDEPENDENT_AMBULATORY_CARE_PROVIDER_SITE_OTHER): Payer: BLUE CROSS/BLUE SHIELD | Admitting: Ophthalmology

## 2017-01-06 DIAGNOSIS — H43813 Vitreous degeneration, bilateral: Secondary | ICD-10-CM

## 2017-01-06 DIAGNOSIS — H348322 Tributary (branch) retinal vein occlusion, left eye, stable: Secondary | ICD-10-CM

## 2017-02-01 ENCOUNTER — Other Ambulatory Visit (HOSPITAL_BASED_OUTPATIENT_CLINIC_OR_DEPARTMENT_OTHER): Payer: Self-pay | Admitting: Obstetrics and Gynecology

## 2017-02-01 DIAGNOSIS — Z1239 Encounter for other screening for malignant neoplasm of breast: Secondary | ICD-10-CM

## 2017-02-02 ENCOUNTER — Telehealth: Payer: Self-pay

## 2017-02-02 NOTE — Telephone Encounter (Signed)
SENT NOTES TO SCHEDULING 

## 2017-02-03 ENCOUNTER — Ambulatory Visit (INDEPENDENT_AMBULATORY_CARE_PROVIDER_SITE_OTHER): Payer: BLUE CROSS/BLUE SHIELD | Admitting: Cardiology

## 2017-02-03 ENCOUNTER — Encounter: Payer: Self-pay | Admitting: Cardiology

## 2017-02-03 VITALS — BP 114/76 | HR 83 | Ht 62.0 in | Wt 128.0 lb

## 2017-02-03 DIAGNOSIS — I447 Left bundle-branch block, unspecified: Secondary | ICD-10-CM | POA: Diagnosis not present

## 2017-02-03 DIAGNOSIS — D6851 Activated protein C resistance: Secondary | ICD-10-CM

## 2017-02-03 DIAGNOSIS — R002 Palpitations: Secondary | ICD-10-CM | POA: Diagnosis not present

## 2017-02-03 NOTE — Progress Notes (Signed)
Cardiology Office Note:    Date:  02/03/2017   ID:  Kayla [name], DOB 1951/06/12, MRN [id number]  PCP:  Karen Kitchens, MD  Cardiologist:  Jenean Lindau, MD   Referring MD: Karen Kitchens, MD    ASSESSMENT:    1. Palpitations   2. Factor V Leiden ([place])    PLAN:    In order of problems listed above:  1. I discussed my findings with the patient at extensive length. In view of the fact that the patient has new-onset bundle-branch block or at least newly diagnosed left bundle branch block I will do a Lexiscan sestamibi. 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 She vocalized understanding. Echocardiogram will be done to assess murmur heard on auscultation. Her blood pressure stable and lipids are followed by her primary care physician. She will be seen in follow-up appointment in 6 months or earlier if she has any concerns.   Medication Adjustments/Labs and Tests Ordered: Current medicines are reviewed at length with the patient today.  Concerns regarding medicines are outlined above.  No orders of the defined types were placed in this encounter.  No orders of the defined types were placed in this encounter.    History of Present Illness:    Kayla [name] is a 66 y.o. female who is being seen today for the evaluation of The left bundle branch block found on routine EKG at the request of Karen Kitchens, MD. Patient is a pleasant 66 year old female. She has past medical history of factor V Leiden for which she is under the care of her primary care physician. She leads an active lifestyle. She was found to have a left bundle branch block and this is new. This happened on a routine EKG. Patient is an active lady she has no history of hypertension and diabetes mellitus and dyslipidemia. She is sent here for evaluation. At the time of my evaluation she is alert awake oriented and in no  distress she works as a Clinical research associate at [employer].  History reviewed. No pertinent past medical history.  Past Surgical History:  Procedure Laterality Date  . APPENDECTOMY     age 66  . FOOT SURGERY Bilateral   . TONSILLECTOMY     age 6    Current Medications: Current Meds  Medication Sig  . Ascorbic Acid (VITAMIN C PO) Take 1 capsule by mouth daily.  Marland Kitchen aspirin 81 MG tablet Take 81 mg by mouth daily.  . B Complex Vitamins (VITAMIN-B COMPLEX) TABS Take 1 tablet by mouth daily.  Mariane Baumgarten Calcium (STOOL SOFTENER PO) Take 1 capsule by mouth daily.  . fish oil-omega-3 fatty acids 1000 MG capsule Take 2 g by mouth daily.  Marland Kitchen ibuprofen (ADVIL,MOTRIN) 200 MG tablet Take 400 mg by mouth as needed for pain.  Marland Kitchen MULTIPLE VITAMIN PO Take 1 tablet by mouth daily.  . Polyethylene Glycol POWD 1 g by Does not apply route daily.     Allergies:   Codeine   Social History   Social History  . Marital status: Married    Spouse name: N/A  . Number of children: 2  . Years of education: N/A   Occupational History  . 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 Smoking status: Never Smoker  . Smokeless tobacco: Never Used  . Alcohol use No  .  Drug use: No  . Sexual activity: Not Asked   Other Topics Concern  . None   Social History Narrative  . None     Family History: The patient's family history includes Congestive Heart Failure in her father; Other in her mother. There is no history of Colon cancer.  ROS:   Please see the history of present illness.    All other systems reviewed and are negative.  EKGs/Labs/Other Studies Reviewed:    The following studies were reviewed today: I reviewed the doctor's office records extensively and questions were answered to the patient's satisfaction.   Recent Labs: No results found for requested labs within last 8760 hours.  Recent Lipid Panel No results found for: CHOL, TRIG, HDL, CHOLHDL, VLDL, LDLCALC,  LDLDIRECT  Physical Exam:    VS:  BP 114/76   Pulse 83   Ht 5\' 2"  (1.575 m)   Wt 128 lb (58.1 kg)   SpO2 98%   BMI 23.41 kg/m     Wt Readings from Last 3 Encounters:  02/03/17 128 lb (58.1 kg)  02/07/13 120 lb (54.4 kg)  01/25/13 120 lb 3.2 oz (54.5 kg)     GEN: Patient is in no acute distress HEENT: Normal NECK: No JVD; No carotid bruits LYMPHATICS: No lymphadenopathy CARDIAC: S1 S2 regular, 2/6 systolic murmur at the apex. RESPIRATORY:  Clear to auscultation without rales, wheezing or rhonchi  ABDOMEN: Soft, non-tender, non-distended MUSCULOSKELETAL:  No edema; No deformity  SKIN: Warm and dry NEUROLOGIC:  Alert and oriented x 3 PSYCHIATRIC:  Normal affect    Signed, Jenean Lindau, MD  02/03/2017 4:12 PM    [facility]

## 2017-02-03 NOTE — Patient Instructions (Addendum)
Medication Instructions:  Your physician recommends that you continue on your current medications as directed. Please refer to the Current Medication list given to you today.  Labwork: None   Testing/Procedures: Your physician has requested that you have an echocardiogram. Echocardiography is a painless test that uses sound waves to create images of your heart. It provides your doctor with information about the size and shape of your heart and how well your heart's chambers and valves are working. This procedure takes approximately one hour. There are no restrictions for this procedure.  Your physician has requested that you have a lexiscan myoview. For further information please visit [email or web address]. Please follow instruction sheet, as given.  Please report to [address], [place] the day of your testing.    Follow-Up: Your physician recommends that you schedule a follow-up appointment in: 2 months   Any Other Special Instructions Will Be Listed Below (If Applicable).  Please note that any paperwork needing to be filled out by the provider will need to be addressed at the front desk prior to seeing the provider. Please note that any paperwork FMLA, Disability or other documents regarding health condition is subject to a $25.00 charge that must be received prior to completion of paperwork in the form of a money order or check.    If you need a refill on your cardiac medications before your next appointment, please call your pharmacy.

## 2017-02-04 ENCOUNTER — Encounter (HOSPITAL_BASED_OUTPATIENT_CLINIC_OR_DEPARTMENT_OTHER): Payer: Self-pay

## 2017-02-04 ENCOUNTER — Ambulatory Visit (HOSPITAL_BASED_OUTPATIENT_CLINIC_OR_DEPARTMENT_OTHER)
Admission: RE | Admit: 2017-02-04 | Discharge: 2017-02-04 | Disposition: A | Discharge status: Home / Self Care | Payer: BLUE CROSS/BLUE SHIELD | Source: Ambulatory Visit | Attending: Obstetrics and Gynecology | Admitting: Obstetrics and Gynecology

## 2017-02-04 DIAGNOSIS — Z1239 Encounter for other screening for malignant neoplasm of breast: Secondary | ICD-10-CM

## 2017-02-04 DIAGNOSIS — Z1231 Encounter for screening mammogram for malignant neoplasm of breast: Secondary | ICD-10-CM | POA: Diagnosis present

## 2017-02-10 ENCOUNTER — Telehealth (HOSPITAL_COMMUNITY): Payer: Self-pay | Admitting: *Deleted

## 2017-02-10 NOTE — Telephone Encounter (Signed)
Left message on voicemail per DPR in reference to upcoming appointment scheduled on 02/12/17 with detailed instructions given per Myocardial Perfusion Study Information Sheet for the test. LM to arrive 15 minutes early, and that it is imperative to arrive on time for appointment to keep from having the test rescheduled. If you need to cancel or reschedule your appointment, please call the office within 24 hours of your appointment. Failure to do so may result in a cancellation of your appointment, and a $50 no show fee. Phone number given for call back for any questions. Kirstie Peri

## 2017-02-12 ENCOUNTER — Ambulatory Visit (HOSPITAL_COMMUNITY): Payer: BLUE CROSS/BLUE SHIELD | Attending: Cardiovascular Disease

## 2017-02-12 ENCOUNTER — Ambulatory Visit (HOSPITAL_BASED_OUTPATIENT_CLINIC_OR_DEPARTMENT_OTHER): Payer: BLUE CROSS/BLUE SHIELD

## 2017-02-12 ENCOUNTER — Other Ambulatory Visit: Payer: Self-pay

## 2017-02-12 DIAGNOSIS — I447 Left bundle-branch block, unspecified: Secondary | ICD-10-CM

## 2017-02-12 DIAGNOSIS — I340 Nonrheumatic mitral (valve) insufficiency: Secondary | ICD-10-CM | POA: Insufficient documentation

## 2017-02-12 DIAGNOSIS — R002 Palpitations: Secondary | ICD-10-CM

## 2017-02-12 LAB — MYOCARDIAL PERFUSION IMAGING
CHL CUP NUCLEAR SDS: 4
CHL CUP RESTING HR STRESS: 56 {beats}/min
LHR: 0.29
LV dias vol: 74 mL (ref 46–106)
LVSYSVOL: 26 mL
Peak HR: 97 {beats}/min
SRS: 12
SSS: 16
TID: 0.94

## 2017-02-12 MED ORDER — REGADENOSON 0.4 MG/5ML IV SOLN
0.4000 mg | Freq: Once | INTRAVENOUS | Status: AC
  Administered 2017-02-12: 0.4 mg via INTRAVENOUS

## 2017-02-12 MED ORDER — TECHNETIUM TC 99M TETROFOSMIN IV KIT
10.3000 | PACK | Freq: Once | INTRAVENOUS | Status: AC | PRN
  Administered 2017-02-12: 10.3 via INTRAVENOUS
  Filled 2017-02-12: qty 11

## 2017-02-12 MED ORDER — TECHNETIUM TC 99M TETROFOSMIN IV KIT
32.3000 | PACK | Freq: Once | INTRAVENOUS | Status: AC | PRN
  Administered 2017-02-12: 32.3 via INTRAVENOUS
  Filled 2017-02-12: qty 33

## 2017-03-03 ENCOUNTER — Encounter (INDEPENDENT_AMBULATORY_CARE_PROVIDER_SITE_OTHER): Payer: BLUE CROSS/BLUE SHIELD | Admitting: Ophthalmology

## 2017-03-03 DIAGNOSIS — H43813 Vitreous degeneration, bilateral: Secondary | ICD-10-CM

## 2017-03-03 DIAGNOSIS — H348322 Tributary (branch) retinal vein occlusion, left eye, stable: Secondary | ICD-10-CM | POA: Diagnosis not present

## 2017-03-03 DIAGNOSIS — H2513 Age-related nuclear cataract, bilateral: Secondary | ICD-10-CM | POA: Diagnosis not present

## 2017-04-05 ENCOUNTER — Encounter: Payer: Self-pay | Admitting: Cardiology

## 2017-04-05 ENCOUNTER — Ambulatory Visit (INDEPENDENT_AMBULATORY_CARE_PROVIDER_SITE_OTHER): Payer: BLUE CROSS/BLUE SHIELD | Admitting: Cardiology

## 2017-04-05 VITALS — BP 108/72 | HR 72 | Ht 62.0 in | Wt 128.0 lb

## 2017-04-05 DIAGNOSIS — I447 Left bundle-branch block, unspecified: Secondary | ICD-10-CM | POA: Diagnosis not present

## 2017-04-05 NOTE — Patient Instructions (Signed)
Medication Instructions:  °Your physician recommends that you continue on your current medications as directed. Please refer to the Current Medication list given to you today. ° ° °Labwork: °None ° ° °Testing/Procedures: °None  ° °Follow-Up: °1 year ° °Any Other Special Instructions Will Be Listed Below (If Applicable). ° °If you need a refill on your cardiac medications before your next appointment, please call your pharmacy. ° °

## 2017-04-05 NOTE — Progress Notes (Signed)
Cardiology Office Note:    Date:  04/05/2017   ID:  Kayla [name], DOB 09-Jun-1951, MRN [id number]  PCP:  Karen Kitchens, MD  Cardiologist:  Jenean Lindau, MD   Referring MD: Karen Kitchens, MD    ASSESSMENT:    No diagnosis found. PLAN:    In order of problems listed above:  1. Patient has resumed exercise vigorously without any symptoms and she is feeling good.She is an active healthy female. I reassured her about the findings with the bundle branch block. She is happy about it. She'll be seen in follow-up appointment for annual basis or earlier if she has any concerns   Medication Adjustments/Labs and Tests Ordered: Current medicines are reviewed at length with the patient today.  Concerns regarding medicines are outlined above.  No orders of the defined types were placed in this encounter.  No orders of the defined types were placed in this encounter.    Chief Complaint  Patient presents with  . Follow-up    NO Concerns     History of Present Illness:    Kayla [name] is a 66 y.o. female . She was evaluated by me for a left bundle branch block. She denies any problems at this time and is care of activities of daily living. She is a very active  No chest pain orthopnea or PND. At the time of my evaluation she is alert awake oriented and in no distress.  History reviewed. No pertinent past medical history.  Past Surgical History:  Procedure Laterality Date  . APPENDECTOMY     age 77  . FOOT SURGERY Bilateral   . TONSILLECTOMY     age 66    Current Medications: Current Meds  Medication Sig  . Ascorbic Acid (VITAMIN C PO) Take 1 capsule by mouth daily.  Marland Kitchen aspirin 81 MG tablet Take 81 mg by mouth daily.  . B Complex Vitamins (VITAMIN-B COMPLEX) TABS Take 1 tablet by mouth daily.  Mariane Baumgarten Calcium (STOOL SOFTENER PO) Take 1 capsule by mouth daily.  . fish oil-omega-3 fatty acids 1000 MG capsule Take 2 g by mouth daily.  Marland Kitchen ibuprofen  (ADVIL,MOTRIN) 200 MG tablet Take 400 mg by mouth as needed for pain.  Marland Kitchen MULTIPLE VITAMIN PO Take 1 tablet by mouth daily.  . Polyethylene Glycol POWD 1 g by Does not apply route daily.     Allergies:   Codeine   Social History   Social History  . Marital status: Married    Spouse name: N/A  . Number of children: 2  . Years of education: N/A   Occupational History  . 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 Smoking status: Never Smoker  . Smokeless tobacco: Never Used  . Alcohol use No  . Drug use: No  . Sexual activity: Not Asked   Other Topics Concern  . None   Social History Narrative  . None     Family History: The patient's family history includes Congestive Heart Failure in her father; Other in her mother. There is no history of Colon cancer.  ROS:   Please see the history of present illness.    All other systems reviewed and are negative.  EKGs/Labs/Other Studies Reviewed:    The following studies were reviewed today: I reviewed echocardiogram and stress test report with her extensively.   Recent Labs: No results found for requested labs within last 8760 hours.  Recent Lipid Panel  No results found for: CHOL, TRIG, HDL, CHOLHDL, VLDL, LDLCALC, LDLDIRECT  Physical Exam:    VS:  BP 108/72   Pulse 72   Ht 5\' 2"  (1.575 m)   Wt 128 lb (58.1 kg)   SpO2 98%   BMI 23.41 kg/m     Wt Readings from Last 3 Encounters:  04/05/17 128 lb (58.1 kg)  02/03/17 128 lb (58.1 kg)  02/07/13 120 lb (54.4 kg)     GEN: Patient is in no acute distress HEENT: Normal NECK: No JVD; No carotid bruits LYMPHATICS: No lymphadenopathy CARDIAC: Hear sounds regular, 2/6 systolic murmur at the apex. RESPIRATORY:  Clear to auscultation without rales, wheezing or rhonchi  ABDOMEN: Soft, non-tender, non-distended MUSCULOSKELETAL:  No edema; No deformity  SKIN: Warm and dry NEUROLOGIC:  Alert and oriented x 3 PSYCHIATRIC:  Normal affect    Signed, Jenean Lindau, MD  04/05/2017 4:40 PM    [facility]

## 2017-05-07 ENCOUNTER — Encounter (INDEPENDENT_AMBULATORY_CARE_PROVIDER_SITE_OTHER): Payer: BLUE CROSS/BLUE SHIELD | Admitting: Ophthalmology

## 2017-05-07 DIAGNOSIS — H348322 Tributary (branch) retinal vein occlusion, left eye, stable: Secondary | ICD-10-CM

## 2017-05-07 DIAGNOSIS — H2513 Age-related nuclear cataract, bilateral: Secondary | ICD-10-CM

## 2017-05-07 DIAGNOSIS — H43813 Vitreous degeneration, bilateral: Secondary | ICD-10-CM

## 2017-07-23 ENCOUNTER — Encounter (INDEPENDENT_AMBULATORY_CARE_PROVIDER_SITE_OTHER): Payer: BLUE CROSS/BLUE SHIELD | Admitting: Ophthalmology

## 2017-07-23 DIAGNOSIS — H35032 Hypertensive retinopathy, left eye: Secondary | ICD-10-CM

## 2017-07-23 DIAGNOSIS — H348322 Tributary (branch) retinal vein occlusion, left eye, stable: Secondary | ICD-10-CM | POA: Diagnosis not present

## 2017-07-23 DIAGNOSIS — H43813 Vitreous degeneration, bilateral: Secondary | ICD-10-CM | POA: Diagnosis not present

## 2017-07-23 DIAGNOSIS — H2513 Age-related nuclear cataract, bilateral: Secondary | ICD-10-CM | POA: Diagnosis not present

## 2017-07-23 DIAGNOSIS — I10 Essential (primary) hypertension: Secondary | ICD-10-CM

## 2017-10-07 ENCOUNTER — Encounter (INDEPENDENT_AMBULATORY_CARE_PROVIDER_SITE_OTHER): Payer: BLUE CROSS/BLUE SHIELD | Admitting: Ophthalmology

## 2017-10-07 DIAGNOSIS — I10 Essential (primary) hypertension: Secondary | ICD-10-CM | POA: Diagnosis not present

## 2017-10-07 DIAGNOSIS — H35033 Hypertensive retinopathy, bilateral: Secondary | ICD-10-CM

## 2017-10-07 DIAGNOSIS — H43813 Vitreous degeneration, bilateral: Secondary | ICD-10-CM

## 2017-10-07 DIAGNOSIS — H33302 Unspecified retinal break, left eye: Secondary | ICD-10-CM

## 2017-10-07 DIAGNOSIS — H2513 Age-related nuclear cataract, bilateral: Secondary | ICD-10-CM | POA: Diagnosis not present

## 2017-10-07 DIAGNOSIS — H348322 Tributary (branch) retinal vein occlusion, left eye, stable: Secondary | ICD-10-CM | POA: Diagnosis not present

## 2017-12-17 ENCOUNTER — Encounter (INDEPENDENT_AMBULATORY_CARE_PROVIDER_SITE_OTHER): Payer: BLUE CROSS/BLUE SHIELD | Admitting: Ophthalmology

## 2018-01-07 ENCOUNTER — Encounter (INDEPENDENT_AMBULATORY_CARE_PROVIDER_SITE_OTHER): Payer: BLUE CROSS/BLUE SHIELD | Admitting: Ophthalmology

## 2018-01-07 DIAGNOSIS — H43813 Vitreous degeneration, bilateral: Secondary | ICD-10-CM | POA: Diagnosis not present

## 2018-01-07 DIAGNOSIS — H33302 Unspecified retinal break, left eye: Secondary | ICD-10-CM

## 2018-01-07 DIAGNOSIS — H2513 Age-related nuclear cataract, bilateral: Secondary | ICD-10-CM | POA: Diagnosis not present

## 2018-01-07 DIAGNOSIS — H348322 Tributary (branch) retinal vein occlusion, left eye, stable: Secondary | ICD-10-CM

## 2018-01-07 DIAGNOSIS — H35033 Hypertensive retinopathy, bilateral: Secondary | ICD-10-CM

## 2018-01-07 DIAGNOSIS — I10 Essential (primary) hypertension: Secondary | ICD-10-CM | POA: Diagnosis not present

## 2018-03-22 DIAGNOSIS — M2392 Unspecified internal derangement of left knee: Secondary | ICD-10-CM | POA: Diagnosis not present

## 2018-03-28 ENCOUNTER — Other Ambulatory Visit (HOSPITAL_BASED_OUTPATIENT_CLINIC_OR_DEPARTMENT_OTHER): Payer: Self-pay | Admitting: Internal Medicine

## 2018-03-28 DIAGNOSIS — Z1231 Encounter for screening mammogram for malignant neoplasm of breast: Secondary | ICD-10-CM

## 2018-03-30 DIAGNOSIS — M2392 Unspecified internal derangement of left knee: Secondary | ICD-10-CM | POA: Diagnosis not present

## 2018-03-30 DIAGNOSIS — M25562 Pain in left knee: Secondary | ICD-10-CM | POA: Diagnosis not present

## 2018-03-31 ENCOUNTER — Ambulatory Visit (HOSPITAL_BASED_OUTPATIENT_CLINIC_OR_DEPARTMENT_OTHER)
Admission: RE | Admit: 2018-03-31 | Discharge: 2018-03-31 | Disposition: A | Discharge status: Home / Self Care | Payer: PPO | Source: Ambulatory Visit | Attending: Internal Medicine | Admitting: Internal Medicine

## 2018-03-31 DIAGNOSIS — Z1231 Encounter for screening mammogram for malignant neoplasm of breast: Secondary | ICD-10-CM | POA: Insufficient documentation

## 2018-04-08 ENCOUNTER — Encounter (INDEPENDENT_AMBULATORY_CARE_PROVIDER_SITE_OTHER): Payer: PPO | Admitting: Ophthalmology

## 2018-04-08 DIAGNOSIS — H35033 Hypertensive retinopathy, bilateral: Secondary | ICD-10-CM | POA: Diagnosis not present

## 2018-04-08 DIAGNOSIS — I10 Essential (primary) hypertension: Secondary | ICD-10-CM | POA: Diagnosis not present

## 2018-04-08 DIAGNOSIS — H33302 Unspecified retinal break, left eye: Secondary | ICD-10-CM | POA: Diagnosis not present

## 2018-04-08 DIAGNOSIS — H43813 Vitreous degeneration, bilateral: Secondary | ICD-10-CM | POA: Diagnosis not present

## 2018-04-08 DIAGNOSIS — H348322 Tributary (branch) retinal vein occlusion, left eye, stable: Secondary | ICD-10-CM

## 2018-05-11 DIAGNOSIS — D229 Melanocytic nevi, unspecified: Secondary | ICD-10-CM | POA: Diagnosis not present

## 2018-05-11 DIAGNOSIS — R490 Dysphonia: Secondary | ICD-10-CM | POA: Diagnosis not present

## 2018-05-27 DIAGNOSIS — R490 Dysphonia: Secondary | ICD-10-CM | POA: Diagnosis not present

## 2018-06-10 DIAGNOSIS — D2239 Melanocytic nevi of other parts of face: Secondary | ICD-10-CM | POA: Diagnosis not present

## 2018-06-10 DIAGNOSIS — L821 Other seborrheic keratosis: Secondary | ICD-10-CM | POA: Diagnosis not present

## 2018-07-08 ENCOUNTER — Encounter (INDEPENDENT_AMBULATORY_CARE_PROVIDER_SITE_OTHER): Payer: PPO | Admitting: Ophthalmology

## 2018-07-08 DIAGNOSIS — H33302 Unspecified retinal break, left eye: Secondary | ICD-10-CM | POA: Diagnosis not present

## 2018-07-08 DIAGNOSIS — H348322 Tributary (branch) retinal vein occlusion, left eye, stable: Secondary | ICD-10-CM | POA: Diagnosis not present

## 2018-07-08 DIAGNOSIS — H43813 Vitreous degeneration, bilateral: Secondary | ICD-10-CM | POA: Diagnosis not present

## 2018-07-18 DIAGNOSIS — M25562 Pain in left knee: Secondary | ICD-10-CM | POA: Diagnosis not present

## 2018-07-18 DIAGNOSIS — M25511 Pain in right shoulder: Secondary | ICD-10-CM | POA: Diagnosis not present

## 2018-07-25 DIAGNOSIS — M25562 Pain in left knee: Secondary | ICD-10-CM | POA: Diagnosis not present

## 2018-07-25 DIAGNOSIS — M25511 Pain in right shoulder: Secondary | ICD-10-CM | POA: Diagnosis not present

## 2018-07-28 DIAGNOSIS — M25511 Pain in right shoulder: Secondary | ICD-10-CM | POA: Diagnosis not present

## 2018-07-28 DIAGNOSIS — M25562 Pain in left knee: Secondary | ICD-10-CM | POA: Diagnosis not present

## 2018-08-02 DIAGNOSIS — M25511 Pain in right shoulder: Secondary | ICD-10-CM | POA: Diagnosis not present

## 2018-08-02 DIAGNOSIS — M25562 Pain in left knee: Secondary | ICD-10-CM | POA: Diagnosis not present

## 2018-08-04 DIAGNOSIS — M25562 Pain in left knee: Secondary | ICD-10-CM | POA: Diagnosis not present

## 2018-08-04 DIAGNOSIS — M25511 Pain in right shoulder: Secondary | ICD-10-CM | POA: Diagnosis not present

## 2018-08-08 DIAGNOSIS — M25511 Pain in right shoulder: Secondary | ICD-10-CM | POA: Diagnosis not present

## 2018-08-08 DIAGNOSIS — M25562 Pain in left knee: Secondary | ICD-10-CM | POA: Diagnosis not present

## 2018-08-09 ENCOUNTER — Encounter (INDEPENDENT_AMBULATORY_CARE_PROVIDER_SITE_OTHER): Payer: PPO | Admitting: Ophthalmology

## 2018-08-09 DIAGNOSIS — H34832 Tributary (branch) retinal vein occlusion, left eye, with macular edema: Secondary | ICD-10-CM | POA: Diagnosis not present

## 2018-08-09 DIAGNOSIS — H33302 Unspecified retinal break, left eye: Secondary | ICD-10-CM | POA: Diagnosis not present

## 2018-08-09 DIAGNOSIS — H2512 Age-related nuclear cataract, left eye: Secondary | ICD-10-CM

## 2018-08-09 DIAGNOSIS — H43813 Vitreous degeneration, bilateral: Secondary | ICD-10-CM | POA: Diagnosis not present

## 2018-09-01 DIAGNOSIS — M25511 Pain in right shoulder: Secondary | ICD-10-CM | POA: Diagnosis not present

## 2018-09-01 DIAGNOSIS — M25562 Pain in left knee: Secondary | ICD-10-CM | POA: Diagnosis not present

## 2018-10-07 ENCOUNTER — Encounter (INDEPENDENT_AMBULATORY_CARE_PROVIDER_SITE_OTHER): Payer: PPO | Admitting: Ophthalmology

## 2018-11-25 ENCOUNTER — Other Ambulatory Visit: Payer: Self-pay

## 2018-11-25 ENCOUNTER — Encounter (INDEPENDENT_AMBULATORY_CARE_PROVIDER_SITE_OTHER): Payer: PPO | Admitting: Ophthalmology

## 2018-11-25 DIAGNOSIS — H348322 Tributary (branch) retinal vein occlusion, left eye, stable: Secondary | ICD-10-CM | POA: Diagnosis not present

## 2018-11-25 DIAGNOSIS — H43813 Vitreous degeneration, bilateral: Secondary | ICD-10-CM

## 2018-11-25 DIAGNOSIS — H33302 Unspecified retinal break, left eye: Secondary | ICD-10-CM | POA: Diagnosis not present

## 2018-11-25 DIAGNOSIS — H2513 Age-related nuclear cataract, bilateral: Secondary | ICD-10-CM | POA: Diagnosis not present

## 2019-01-10 DIAGNOSIS — M533 Sacrococcygeal disorders, not elsewhere classified: Secondary | ICD-10-CM | POA: Insufficient documentation

## 2019-01-10 DIAGNOSIS — D6851 Activated protein C resistance: Secondary | ICD-10-CM | POA: Diagnosis not present

## 2019-01-10 DIAGNOSIS — Z0000 Encounter for general adult medical examination without abnormal findings: Secondary | ICD-10-CM | POA: Diagnosis not present

## 2019-01-10 DIAGNOSIS — B351 Tinea unguium: Secondary | ICD-10-CM | POA: Diagnosis not present

## 2019-01-11 DIAGNOSIS — M533 Sacrococcygeal disorders, not elsewhere classified: Secondary | ICD-10-CM | POA: Diagnosis not present

## 2019-01-11 DIAGNOSIS — M545 Low back pain: Secondary | ICD-10-CM | POA: Diagnosis not present

## 2019-03-03 ENCOUNTER — Encounter (INDEPENDENT_AMBULATORY_CARE_PROVIDER_SITE_OTHER): Payer: PPO | Admitting: Ophthalmology

## 2019-04-17 ENCOUNTER — Other Ambulatory Visit (HOSPITAL_BASED_OUTPATIENT_CLINIC_OR_DEPARTMENT_OTHER): Payer: Self-pay | Admitting: Internal Medicine

## 2019-04-17 DIAGNOSIS — Z1231 Encounter for screening mammogram for malignant neoplasm of breast: Secondary | ICD-10-CM

## 2019-04-26 ENCOUNTER — Other Ambulatory Visit: Payer: Self-pay

## 2019-04-26 ENCOUNTER — Ambulatory Visit (HOSPITAL_BASED_OUTPATIENT_CLINIC_OR_DEPARTMENT_OTHER)
Admission: RE | Admit: 2019-04-26 | Discharge: 2019-04-26 | Disposition: A | Discharge status: Home / Self Care | Payer: PPO | Source: Ambulatory Visit | Attending: Internal Medicine | Admitting: Internal Medicine

## 2019-04-26 DIAGNOSIS — Z1231 Encounter for screening mammogram for malignant neoplasm of breast: Secondary | ICD-10-CM | POA: Diagnosis not present

## 2019-04-28 ENCOUNTER — Other Ambulatory Visit: Payer: Self-pay | Admitting: Internal Medicine

## 2019-04-28 DIAGNOSIS — R928 Other abnormal and inconclusive findings on diagnostic imaging of breast: Secondary | ICD-10-CM

## 2019-05-02 ENCOUNTER — Ambulatory Visit
Admission: RE | Admit: 2019-05-02 | Discharge: 2019-05-02 | Disposition: A | Discharge status: Home / Self Care | Payer: PPO | Source: Ambulatory Visit | Attending: Internal Medicine | Admitting: Internal Medicine

## 2019-05-02 ENCOUNTER — Other Ambulatory Visit: Payer: Self-pay

## 2019-05-02 ENCOUNTER — Other Ambulatory Visit: Payer: Self-pay | Admitting: Internal Medicine

## 2019-05-02 DIAGNOSIS — M7989 Other specified soft tissue disorders: Secondary | ICD-10-CM | POA: Insufficient documentation

## 2019-05-02 DIAGNOSIS — D6851 Activated protein C resistance: Secondary | ICD-10-CM | POA: Diagnosis not present

## 2019-05-02 DIAGNOSIS — R928 Other abnormal and inconclusive findings on diagnostic imaging of breast: Secondary | ICD-10-CM

## 2019-05-02 DIAGNOSIS — R921 Mammographic calcification found on diagnostic imaging of breast: Secondary | ICD-10-CM | POA: Diagnosis not present

## 2019-05-02 DIAGNOSIS — R600 Localized edema: Secondary | ICD-10-CM | POA: Diagnosis not present

## 2019-05-12 ENCOUNTER — Encounter (INDEPENDENT_AMBULATORY_CARE_PROVIDER_SITE_OTHER): Payer: PPO | Admitting: Ophthalmology

## 2019-06-14 ENCOUNTER — Encounter (INDEPENDENT_AMBULATORY_CARE_PROVIDER_SITE_OTHER): Payer: PPO | Admitting: Ophthalmology

## 2019-06-14 DIAGNOSIS — H35033 Hypertensive retinopathy, bilateral: Secondary | ICD-10-CM | POA: Diagnosis not present

## 2019-06-14 DIAGNOSIS — H43813 Vitreous degeneration, bilateral: Secondary | ICD-10-CM

## 2019-06-14 DIAGNOSIS — H33302 Unspecified retinal break, left eye: Secondary | ICD-10-CM | POA: Diagnosis not present

## 2019-06-14 DIAGNOSIS — H348322 Tributary (branch) retinal vein occlusion, left eye, stable: Secondary | ICD-10-CM

## 2019-06-14 DIAGNOSIS — I10 Essential (primary) hypertension: Secondary | ICD-10-CM

## 2019-07-31 DIAGNOSIS — T23232A Burn of second degree of multiple left fingers (nail), not including thumb, initial encounter: Secondary | ICD-10-CM | POA: Diagnosis not present

## 2019-07-31 DIAGNOSIS — X102XXA Contact with fats and cooking oils, initial encounter: Secondary | ICD-10-CM | POA: Diagnosis not present

## 2019-07-31 DIAGNOSIS — T23202A Burn of second degree of left hand, unspecified site, initial encounter: Secondary | ICD-10-CM | POA: Diagnosis not present

## 2019-10-30 ENCOUNTER — Other Ambulatory Visit: Payer: Self-pay

## 2019-10-30 ENCOUNTER — Other Ambulatory Visit: Payer: Self-pay | Admitting: Internal Medicine

## 2019-10-30 ENCOUNTER — Ambulatory Visit
Admission: RE | Admit: 2019-10-30 | Discharge: 2019-10-30 | Disposition: A | Discharge status: Home / Self Care | Payer: PPO | Source: Ambulatory Visit | Attending: Internal Medicine | Admitting: Internal Medicine

## 2019-10-30 DIAGNOSIS — R928 Other abnormal and inconclusive findings on diagnostic imaging of breast: Secondary | ICD-10-CM

## 2019-10-30 DIAGNOSIS — R921 Mammographic calcification found on diagnostic imaging of breast: Secondary | ICD-10-CM | POA: Diagnosis not present

## 2019-11-27 ENCOUNTER — Ambulatory Visit (INDEPENDENT_AMBULATORY_CARE_PROVIDER_SITE_OTHER): Payer: PPO

## 2019-11-27 ENCOUNTER — Other Ambulatory Visit: Payer: Self-pay

## 2019-11-27 ENCOUNTER — Encounter: Payer: Self-pay | Admitting: Podiatry

## 2019-11-27 ENCOUNTER — Ambulatory Visit: Payer: PPO | Admitting: Podiatry

## 2019-11-27 VITALS — BP 137/85 | HR 76 | Temp 98.1°F | Resp 16

## 2019-11-27 DIAGNOSIS — L84 Corns and callosities: Secondary | ICD-10-CM | POA: Diagnosis not present

## 2019-11-27 DIAGNOSIS — M2012 Hallux valgus (acquired), left foot: Secondary | ICD-10-CM

## 2019-11-27 DIAGNOSIS — M2011 Hallux valgus (acquired), right foot: Secondary | ICD-10-CM

## 2019-11-27 DIAGNOSIS — M2041 Other hammer toe(s) (acquired), right foot: Secondary | ICD-10-CM | POA: Diagnosis not present

## 2019-11-27 NOTE — Progress Notes (Signed)
   Subjective:    Patient ID: Kayla [name], female    DOB: 01/08/1951, 69 y.o.   MRN: [id number]  HPI    Review of Systems     Objective:   Physical Exam        Assessment & Plan:

## 2019-11-27 NOTE — Progress Notes (Signed)
   Subjective:    Patient ID: Kayla [name], female    DOB: [birth year], 69 y.o.   MRN: [id number]  HPI    Review of Systems  All other systems reviewed and are negative.      Objective:   Physical Exam        Assessment & Plan:

## 2019-11-27 NOTE — Progress Notes (Signed)
Subjective:   Patient ID: Kayla [name], female   DOB: 69 y.o.   MRN: [id number]   HPI Patient presents stating that she is developing callus formation bilateral the big toe on her's right foot is leaving against second toe creating irritation.  Also concerned about some nail disease and states that she has a lot of just tenderness in general in her feet.  Patient does not smoke likes to be active and had bunion surgery number years ago   Review of Systems  All other systems reviewed and are negative.       Objective:  Physical Exam Vitals and nursing note reviewed.  Constitutional:      Appearance: She is well-developed.  Pulmonary:     Effort: Pulmonary effort is normal.  Musculoskeletal:        General: Normal range of motion.  Skin:    General: Skin is warm.  Neurological:     Mental Status: She is alert.     Neurovascular status was found to be intact muscle strength was found to be within normal limits.  Patient is noted to have mild reoccurrence bunion right clinically with good incision site with the left showing good alignment.  There is a keratotic lesion on the medial side of the right first metatarsal second digit right and along the left hallux that becomes discomforting and she does appear to have abnormal pressure points.  Patient has good digital perfusion well oriented x3     Assessment:  Structural abnormalities of both feet with keratotic lesion formation with pain     Plan:  H&P reviewed all conditions debrided lesions bilateral discussed bunion and bone spur right first metatarsal with possibility for surgical intervention in future but at this time we will get a hold off and see if we can just get it better with trimming and cushioning.  Reappoint to recheck  X-rays indicate there is structural bunion deformity reoccurrence of right but clinically it does not appear to be too significant with good alignment left with pins in alignment and fixation

## 2019-12-05 DIAGNOSIS — L603 Nail dystrophy: Secondary | ICD-10-CM | POA: Diagnosis not present

## 2019-12-05 DIAGNOSIS — L821 Other seborrheic keratosis: Secondary | ICD-10-CM | POA: Diagnosis not present

## 2019-12-05 DIAGNOSIS — L570 Actinic keratosis: Secondary | ICD-10-CM | POA: Diagnosis not present

## 2019-12-15 ENCOUNTER — Other Ambulatory Visit: Payer: Self-pay

## 2019-12-15 ENCOUNTER — Encounter (INDEPENDENT_AMBULATORY_CARE_PROVIDER_SITE_OTHER): Payer: PPO | Admitting: Ophthalmology

## 2019-12-15 DIAGNOSIS — H348322 Tributary (branch) retinal vein occlusion, left eye, stable: Secondary | ICD-10-CM

## 2019-12-15 DIAGNOSIS — H35033 Hypertensive retinopathy, bilateral: Secondary | ICD-10-CM

## 2019-12-15 DIAGNOSIS — H43813 Vitreous degeneration, bilateral: Secondary | ICD-10-CM | POA: Diagnosis not present

## 2019-12-15 DIAGNOSIS — I10 Essential (primary) hypertension: Secondary | ICD-10-CM

## 2020-03-22 DIAGNOSIS — M9903 Segmental and somatic dysfunction of lumbar region: Secondary | ICD-10-CM | POA: Diagnosis not present

## 2020-03-22 DIAGNOSIS — M9905 Segmental and somatic dysfunction of pelvic region: Secondary | ICD-10-CM | POA: Diagnosis not present

## 2020-03-22 DIAGNOSIS — M9902 Segmental and somatic dysfunction of thoracic region: Secondary | ICD-10-CM | POA: Diagnosis not present

## 2020-03-22 DIAGNOSIS — M4316 Spondylolisthesis, lumbar region: Secondary | ICD-10-CM | POA: Diagnosis not present

## 2020-03-22 DIAGNOSIS — M5417 Radiculopathy, lumbosacral region: Secondary | ICD-10-CM | POA: Diagnosis not present

## 2020-03-25 DIAGNOSIS — M9903 Segmental and somatic dysfunction of lumbar region: Secondary | ICD-10-CM | POA: Diagnosis not present

## 2020-03-25 DIAGNOSIS — M5417 Radiculopathy, lumbosacral region: Secondary | ICD-10-CM | POA: Diagnosis not present

## 2020-03-25 DIAGNOSIS — M9905 Segmental and somatic dysfunction of pelvic region: Secondary | ICD-10-CM | POA: Diagnosis not present

## 2020-03-25 DIAGNOSIS — M9902 Segmental and somatic dysfunction of thoracic region: Secondary | ICD-10-CM | POA: Diagnosis not present

## 2020-03-25 DIAGNOSIS — L821 Other seborrheic keratosis: Secondary | ICD-10-CM | POA: Diagnosis not present

## 2020-03-25 DIAGNOSIS — M4316 Spondylolisthesis, lumbar region: Secondary | ICD-10-CM | POA: Diagnosis not present

## 2020-03-27 DIAGNOSIS — M5417 Radiculopathy, lumbosacral region: Secondary | ICD-10-CM | POA: Diagnosis not present

## 2020-03-27 DIAGNOSIS — M9902 Segmental and somatic dysfunction of thoracic region: Secondary | ICD-10-CM | POA: Diagnosis not present

## 2020-03-27 DIAGNOSIS — M9903 Segmental and somatic dysfunction of lumbar region: Secondary | ICD-10-CM | POA: Diagnosis not present

## 2020-03-27 DIAGNOSIS — M4316 Spondylolisthesis, lumbar region: Secondary | ICD-10-CM | POA: Diagnosis not present

## 2020-03-27 DIAGNOSIS — M9905 Segmental and somatic dysfunction of pelvic region: Secondary | ICD-10-CM | POA: Diagnosis not present

## 2020-03-29 DIAGNOSIS — M9903 Segmental and somatic dysfunction of lumbar region: Secondary | ICD-10-CM | POA: Diagnosis not present

## 2020-03-29 DIAGNOSIS — M9902 Segmental and somatic dysfunction of thoracic region: Secondary | ICD-10-CM | POA: Diagnosis not present

## 2020-03-29 DIAGNOSIS — M4316 Spondylolisthesis, lumbar region: Secondary | ICD-10-CM | POA: Diagnosis not present

## 2020-03-29 DIAGNOSIS — M5417 Radiculopathy, lumbosacral region: Secondary | ICD-10-CM | POA: Diagnosis not present

## 2020-03-29 DIAGNOSIS — M9905 Segmental and somatic dysfunction of pelvic region: Secondary | ICD-10-CM | POA: Diagnosis not present

## 2020-04-01 DIAGNOSIS — M5417 Radiculopathy, lumbosacral region: Secondary | ICD-10-CM | POA: Diagnosis not present

## 2020-04-01 DIAGNOSIS — M9905 Segmental and somatic dysfunction of pelvic region: Secondary | ICD-10-CM | POA: Diagnosis not present

## 2020-04-01 DIAGNOSIS — M4316 Spondylolisthesis, lumbar region: Secondary | ICD-10-CM | POA: Diagnosis not present

## 2020-04-01 DIAGNOSIS — M9903 Segmental and somatic dysfunction of lumbar region: Secondary | ICD-10-CM | POA: Diagnosis not present

## 2020-04-01 DIAGNOSIS — M9902 Segmental and somatic dysfunction of thoracic region: Secondary | ICD-10-CM | POA: Diagnosis not present

## 2020-04-03 DIAGNOSIS — M5417 Radiculopathy, lumbosacral region: Secondary | ICD-10-CM | POA: Diagnosis not present

## 2020-04-03 DIAGNOSIS — M9903 Segmental and somatic dysfunction of lumbar region: Secondary | ICD-10-CM | POA: Diagnosis not present

## 2020-04-03 DIAGNOSIS — M9902 Segmental and somatic dysfunction of thoracic region: Secondary | ICD-10-CM | POA: Diagnosis not present

## 2020-04-03 DIAGNOSIS — M9905 Segmental and somatic dysfunction of pelvic region: Secondary | ICD-10-CM | POA: Diagnosis not present

## 2020-04-03 DIAGNOSIS — M4316 Spondylolisthesis, lumbar region: Secondary | ICD-10-CM | POA: Diagnosis not present

## 2020-04-05 DIAGNOSIS — M9905 Segmental and somatic dysfunction of pelvic region: Secondary | ICD-10-CM | POA: Diagnosis not present

## 2020-04-05 DIAGNOSIS — M4316 Spondylolisthesis, lumbar region: Secondary | ICD-10-CM | POA: Diagnosis not present

## 2020-04-05 DIAGNOSIS — M5417 Radiculopathy, lumbosacral region: Secondary | ICD-10-CM | POA: Diagnosis not present

## 2020-04-05 DIAGNOSIS — M9903 Segmental and somatic dysfunction of lumbar region: Secondary | ICD-10-CM | POA: Diagnosis not present

## 2020-04-05 DIAGNOSIS — M9902 Segmental and somatic dysfunction of thoracic region: Secondary | ICD-10-CM | POA: Diagnosis not present

## 2020-04-08 DIAGNOSIS — M4316 Spondylolisthesis, lumbar region: Secondary | ICD-10-CM | POA: Diagnosis not present

## 2020-04-08 DIAGNOSIS — M5417 Radiculopathy, lumbosacral region: Secondary | ICD-10-CM | POA: Diagnosis not present

## 2020-04-08 DIAGNOSIS — M9903 Segmental and somatic dysfunction of lumbar region: Secondary | ICD-10-CM | POA: Diagnosis not present

## 2020-04-08 DIAGNOSIS — M9905 Segmental and somatic dysfunction of pelvic region: Secondary | ICD-10-CM | POA: Diagnosis not present

## 2020-04-08 DIAGNOSIS — M9902 Segmental and somatic dysfunction of thoracic region: Secondary | ICD-10-CM | POA: Diagnosis not present

## 2020-04-10 DIAGNOSIS — M5417 Radiculopathy, lumbosacral region: Secondary | ICD-10-CM | POA: Diagnosis not present

## 2020-04-10 DIAGNOSIS — M9903 Segmental and somatic dysfunction of lumbar region: Secondary | ICD-10-CM | POA: Diagnosis not present

## 2020-04-10 DIAGNOSIS — M9905 Segmental and somatic dysfunction of pelvic region: Secondary | ICD-10-CM | POA: Diagnosis not present

## 2020-04-10 DIAGNOSIS — M9902 Segmental and somatic dysfunction of thoracic region: Secondary | ICD-10-CM | POA: Diagnosis not present

## 2020-04-10 DIAGNOSIS — M4316 Spondylolisthesis, lumbar region: Secondary | ICD-10-CM | POA: Diagnosis not present

## 2020-04-12 DIAGNOSIS — M5417 Radiculopathy, lumbosacral region: Secondary | ICD-10-CM | POA: Diagnosis not present

## 2020-04-12 DIAGNOSIS — M9905 Segmental and somatic dysfunction of pelvic region: Secondary | ICD-10-CM | POA: Diagnosis not present

## 2020-04-12 DIAGNOSIS — M9902 Segmental and somatic dysfunction of thoracic region: Secondary | ICD-10-CM | POA: Diagnosis not present

## 2020-04-12 DIAGNOSIS — M9903 Segmental and somatic dysfunction of lumbar region: Secondary | ICD-10-CM | POA: Diagnosis not present

## 2020-04-12 DIAGNOSIS — M4316 Spondylolisthesis, lumbar region: Secondary | ICD-10-CM | POA: Diagnosis not present

## 2020-04-15 DIAGNOSIS — M9905 Segmental and somatic dysfunction of pelvic region: Secondary | ICD-10-CM | POA: Diagnosis not present

## 2020-04-15 DIAGNOSIS — M9902 Segmental and somatic dysfunction of thoracic region: Secondary | ICD-10-CM | POA: Diagnosis not present

## 2020-04-15 DIAGNOSIS — M9903 Segmental and somatic dysfunction of lumbar region: Secondary | ICD-10-CM | POA: Diagnosis not present

## 2020-04-15 DIAGNOSIS — M4316 Spondylolisthesis, lumbar region: Secondary | ICD-10-CM | POA: Diagnosis not present

## 2020-04-15 DIAGNOSIS — M5417 Radiculopathy, lumbosacral region: Secondary | ICD-10-CM | POA: Diagnosis not present

## 2020-04-17 DIAGNOSIS — M9905 Segmental and somatic dysfunction of pelvic region: Secondary | ICD-10-CM | POA: Diagnosis not present

## 2020-04-17 DIAGNOSIS — M5417 Radiculopathy, lumbosacral region: Secondary | ICD-10-CM | POA: Diagnosis not present

## 2020-04-17 DIAGNOSIS — M9903 Segmental and somatic dysfunction of lumbar region: Secondary | ICD-10-CM | POA: Diagnosis not present

## 2020-04-17 DIAGNOSIS — M4316 Spondylolisthesis, lumbar region: Secondary | ICD-10-CM | POA: Diagnosis not present

## 2020-04-17 DIAGNOSIS — M9902 Segmental and somatic dysfunction of thoracic region: Secondary | ICD-10-CM | POA: Diagnosis not present

## 2020-04-22 DIAGNOSIS — M9905 Segmental and somatic dysfunction of pelvic region: Secondary | ICD-10-CM | POA: Diagnosis not present

## 2020-04-22 DIAGNOSIS — M5417 Radiculopathy, lumbosacral region: Secondary | ICD-10-CM | POA: Diagnosis not present

## 2020-04-22 DIAGNOSIS — M9903 Segmental and somatic dysfunction of lumbar region: Secondary | ICD-10-CM | POA: Diagnosis not present

## 2020-04-22 DIAGNOSIS — M9902 Segmental and somatic dysfunction of thoracic region: Secondary | ICD-10-CM | POA: Diagnosis not present

## 2020-04-22 DIAGNOSIS — M4316 Spondylolisthesis, lumbar region: Secondary | ICD-10-CM | POA: Diagnosis not present

## 2020-04-24 DIAGNOSIS — M9905 Segmental and somatic dysfunction of pelvic region: Secondary | ICD-10-CM | POA: Diagnosis not present

## 2020-04-24 DIAGNOSIS — M4316 Spondylolisthesis, lumbar region: Secondary | ICD-10-CM | POA: Diagnosis not present

## 2020-04-24 DIAGNOSIS — M9902 Segmental and somatic dysfunction of thoracic region: Secondary | ICD-10-CM | POA: Diagnosis not present

## 2020-04-24 DIAGNOSIS — M9903 Segmental and somatic dysfunction of lumbar region: Secondary | ICD-10-CM | POA: Diagnosis not present

## 2020-04-24 DIAGNOSIS — M5417 Radiculopathy, lumbosacral region: Secondary | ICD-10-CM | POA: Diagnosis not present

## 2020-04-26 ENCOUNTER — Other Ambulatory Visit: Payer: Self-pay

## 2020-04-26 ENCOUNTER — Ambulatory Visit
Admission: RE | Admit: 2020-04-26 | Discharge: 2020-04-26 | Disposition: A | Discharge status: Home / Self Care | Payer: PPO | Source: Ambulatory Visit | Attending: Internal Medicine | Admitting: Internal Medicine

## 2020-04-26 DIAGNOSIS — R928 Other abnormal and inconclusive findings on diagnostic imaging of breast: Secondary | ICD-10-CM

## 2020-04-26 DIAGNOSIS — R921 Mammographic calcification found on diagnostic imaging of breast: Secondary | ICD-10-CM | POA: Diagnosis not present

## 2020-04-29 DIAGNOSIS — M5417 Radiculopathy, lumbosacral region: Secondary | ICD-10-CM | POA: Diagnosis not present

## 2020-04-29 DIAGNOSIS — M9905 Segmental and somatic dysfunction of pelvic region: Secondary | ICD-10-CM | POA: Diagnosis not present

## 2020-04-29 DIAGNOSIS — M9902 Segmental and somatic dysfunction of thoracic region: Secondary | ICD-10-CM | POA: Diagnosis not present

## 2020-04-29 DIAGNOSIS — M9903 Segmental and somatic dysfunction of lumbar region: Secondary | ICD-10-CM | POA: Diagnosis not present

## 2020-04-29 DIAGNOSIS — M4316 Spondylolisthesis, lumbar region: Secondary | ICD-10-CM | POA: Diagnosis not present

## 2020-05-01 DIAGNOSIS — M9905 Segmental and somatic dysfunction of pelvic region: Secondary | ICD-10-CM | POA: Diagnosis not present

## 2020-05-01 DIAGNOSIS — M4316 Spondylolisthesis, lumbar region: Secondary | ICD-10-CM | POA: Diagnosis not present

## 2020-05-01 DIAGNOSIS — M9903 Segmental and somatic dysfunction of lumbar region: Secondary | ICD-10-CM | POA: Diagnosis not present

## 2020-05-01 DIAGNOSIS — M9902 Segmental and somatic dysfunction of thoracic region: Secondary | ICD-10-CM | POA: Diagnosis not present

## 2020-05-01 DIAGNOSIS — M5417 Radiculopathy, lumbosacral region: Secondary | ICD-10-CM | POA: Diagnosis not present

## 2020-05-08 DIAGNOSIS — M5417 Radiculopathy, lumbosacral region: Secondary | ICD-10-CM | POA: Diagnosis not present

## 2020-05-08 DIAGNOSIS — M9903 Segmental and somatic dysfunction of lumbar region: Secondary | ICD-10-CM | POA: Diagnosis not present

## 2020-05-08 DIAGNOSIS — M9902 Segmental and somatic dysfunction of thoracic region: Secondary | ICD-10-CM | POA: Diagnosis not present

## 2020-05-08 DIAGNOSIS — M9905 Segmental and somatic dysfunction of pelvic region: Secondary | ICD-10-CM | POA: Diagnosis not present

## 2020-05-08 DIAGNOSIS — M4316 Spondylolisthesis, lumbar region: Secondary | ICD-10-CM | POA: Diagnosis not present

## 2020-05-15 DIAGNOSIS — M9902 Segmental and somatic dysfunction of thoracic region: Secondary | ICD-10-CM | POA: Diagnosis not present

## 2020-05-15 DIAGNOSIS — M9903 Segmental and somatic dysfunction of lumbar region: Secondary | ICD-10-CM | POA: Diagnosis not present

## 2020-05-15 DIAGNOSIS — M5417 Radiculopathy, lumbosacral region: Secondary | ICD-10-CM | POA: Diagnosis not present

## 2020-05-15 DIAGNOSIS — M4316 Spondylolisthesis, lumbar region: Secondary | ICD-10-CM | POA: Diagnosis not present

## 2020-05-15 DIAGNOSIS — M9905 Segmental and somatic dysfunction of pelvic region: Secondary | ICD-10-CM | POA: Diagnosis not present

## 2020-05-22 DIAGNOSIS — M4316 Spondylolisthesis, lumbar region: Secondary | ICD-10-CM | POA: Diagnosis not present

## 2020-05-22 DIAGNOSIS — M9902 Segmental and somatic dysfunction of thoracic region: Secondary | ICD-10-CM | POA: Diagnosis not present

## 2020-05-22 DIAGNOSIS — M5417 Radiculopathy, lumbosacral region: Secondary | ICD-10-CM | POA: Diagnosis not present

## 2020-05-22 DIAGNOSIS — M9905 Segmental and somatic dysfunction of pelvic region: Secondary | ICD-10-CM | POA: Diagnosis not present

## 2020-05-22 DIAGNOSIS — M9903 Segmental and somatic dysfunction of lumbar region: Secondary | ICD-10-CM | POA: Diagnosis not present

## 2020-05-29 DIAGNOSIS — M9902 Segmental and somatic dysfunction of thoracic region: Secondary | ICD-10-CM | POA: Diagnosis not present

## 2020-05-29 DIAGNOSIS — M9905 Segmental and somatic dysfunction of pelvic region: Secondary | ICD-10-CM | POA: Diagnosis not present

## 2020-05-29 DIAGNOSIS — M9903 Segmental and somatic dysfunction of lumbar region: Secondary | ICD-10-CM | POA: Diagnosis not present

## 2020-05-29 DIAGNOSIS — M5417 Radiculopathy, lumbosacral region: Secondary | ICD-10-CM | POA: Diagnosis not present

## 2020-05-29 DIAGNOSIS — M4316 Spondylolisthesis, lumbar region: Secondary | ICD-10-CM | POA: Diagnosis not present

## 2020-05-30 DIAGNOSIS — Z7409 Other reduced mobility: Secondary | ICD-10-CM | POA: Diagnosis not present

## 2020-05-30 DIAGNOSIS — M545 Low back pain, unspecified: Secondary | ICD-10-CM | POA: Diagnosis not present

## 2020-05-30 DIAGNOSIS — M533 Sacrococcygeal disorders, not elsewhere classified: Secondary | ICD-10-CM | POA: Diagnosis not present

## 2020-05-30 DIAGNOSIS — Z789 Other specified health status: Secondary | ICD-10-CM | POA: Diagnosis not present

## 2020-05-30 DIAGNOSIS — R29898 Other symptoms and signs involving the musculoskeletal system: Secondary | ICD-10-CM | POA: Diagnosis not present

## 2020-05-31 DIAGNOSIS — M160 Bilateral primary osteoarthritis of hip: Secondary | ICD-10-CM | POA: Diagnosis not present

## 2020-05-31 DIAGNOSIS — M47818 Spondylosis without myelopathy or radiculopathy, sacral and sacrococcygeal region: Secondary | ICD-10-CM | POA: Diagnosis not present

## 2020-05-31 DIAGNOSIS — M25562 Pain in left knee: Secondary | ICD-10-CM | POA: Diagnosis not present

## 2020-05-31 DIAGNOSIS — M47816 Spondylosis without myelopathy or radiculopathy, lumbar region: Secondary | ICD-10-CM | POA: Diagnosis not present

## 2020-05-31 DIAGNOSIS — M533 Sacrococcygeal disorders, not elsewhere classified: Secondary | ICD-10-CM | POA: Diagnosis not present

## 2020-05-31 DIAGNOSIS — M461 Sacroiliitis, not elsewhere classified: Secondary | ICD-10-CM | POA: Diagnosis not present

## 2020-06-04 ENCOUNTER — Other Ambulatory Visit: Payer: Self-pay | Admitting: Internal Medicine

## 2020-06-04 DIAGNOSIS — R928 Other abnormal and inconclusive findings on diagnostic imaging of breast: Secondary | ICD-10-CM

## 2020-06-07 ENCOUNTER — Encounter (INDEPENDENT_AMBULATORY_CARE_PROVIDER_SITE_OTHER): Payer: PPO | Admitting: Ophthalmology

## 2020-06-07 ENCOUNTER — Other Ambulatory Visit: Payer: Self-pay

## 2020-06-07 DIAGNOSIS — H43813 Vitreous degeneration, bilateral: Secondary | ICD-10-CM | POA: Diagnosis not present

## 2020-06-07 DIAGNOSIS — H33302 Unspecified retinal break, left eye: Secondary | ICD-10-CM | POA: Diagnosis not present

## 2020-06-07 DIAGNOSIS — H2513 Age-related nuclear cataract, bilateral: Secondary | ICD-10-CM

## 2020-06-07 DIAGNOSIS — H348322 Tributary (branch) retinal vein occlusion, left eye, stable: Secondary | ICD-10-CM

## 2020-06-24 DIAGNOSIS — Z789 Other specified health status: Secondary | ICD-10-CM | POA: Diagnosis not present

## 2020-06-24 DIAGNOSIS — R29898 Other symptoms and signs involving the musculoskeletal system: Secondary | ICD-10-CM | POA: Diagnosis not present

## 2020-06-24 DIAGNOSIS — M545 Low back pain, unspecified: Secondary | ICD-10-CM | POA: Diagnosis not present

## 2020-06-24 DIAGNOSIS — M533 Sacrococcygeal disorders, not elsewhere classified: Secondary | ICD-10-CM | POA: Diagnosis not present

## 2020-06-24 DIAGNOSIS — Z7409 Other reduced mobility: Secondary | ICD-10-CM | POA: Diagnosis not present

## 2020-07-02 DIAGNOSIS — R29898 Other symptoms and signs involving the musculoskeletal system: Secondary | ICD-10-CM | POA: Diagnosis not present

## 2020-07-02 DIAGNOSIS — M545 Low back pain, unspecified: Secondary | ICD-10-CM | POA: Diagnosis not present

## 2020-07-02 DIAGNOSIS — Z7409 Other reduced mobility: Secondary | ICD-10-CM | POA: Diagnosis not present

## 2020-07-02 DIAGNOSIS — M533 Sacrococcygeal disorders, not elsewhere classified: Secondary | ICD-10-CM | POA: Diagnosis not present

## 2020-07-02 DIAGNOSIS — Z789 Other specified health status: Secondary | ICD-10-CM | POA: Diagnosis not present

## 2020-07-11 DIAGNOSIS — Z7409 Other reduced mobility: Secondary | ICD-10-CM | POA: Diagnosis not present

## 2020-07-11 DIAGNOSIS — M545 Low back pain, unspecified: Secondary | ICD-10-CM | POA: Diagnosis not present

## 2020-07-11 DIAGNOSIS — Z789 Other specified health status: Secondary | ICD-10-CM | POA: Diagnosis not present

## 2020-07-11 DIAGNOSIS — M533 Sacrococcygeal disorders, not elsewhere classified: Secondary | ICD-10-CM | POA: Diagnosis not present

## 2020-07-11 DIAGNOSIS — R29898 Other symptoms and signs involving the musculoskeletal system: Secondary | ICD-10-CM | POA: Diagnosis not present

## 2020-07-16 DIAGNOSIS — R29898 Other symptoms and signs involving the musculoskeletal system: Secondary | ICD-10-CM | POA: Diagnosis not present

## 2020-07-16 DIAGNOSIS — Z789 Other specified health status: Secondary | ICD-10-CM | POA: Diagnosis not present

## 2020-07-16 DIAGNOSIS — Z7409 Other reduced mobility: Secondary | ICD-10-CM | POA: Diagnosis not present

## 2020-07-16 DIAGNOSIS — M545 Low back pain, unspecified: Secondary | ICD-10-CM | POA: Diagnosis not present

## 2020-07-16 DIAGNOSIS — M533 Sacrococcygeal disorders, not elsewhere classified: Secondary | ICD-10-CM | POA: Diagnosis not present

## 2020-07-29 DIAGNOSIS — Z7409 Other reduced mobility: Secondary | ICD-10-CM | POA: Diagnosis not present

## 2020-07-29 DIAGNOSIS — M545 Low back pain, unspecified: Secondary | ICD-10-CM | POA: Diagnosis not present

## 2020-07-29 DIAGNOSIS — R29898 Other symptoms and signs involving the musculoskeletal system: Secondary | ICD-10-CM | POA: Diagnosis not present

## 2020-07-29 DIAGNOSIS — Z789 Other specified health status: Secondary | ICD-10-CM | POA: Diagnosis not present

## 2020-07-29 DIAGNOSIS — M533 Sacrococcygeal disorders, not elsewhere classified: Secondary | ICD-10-CM | POA: Diagnosis not present

## 2020-08-20 DIAGNOSIS — R29898 Other symptoms and signs involving the musculoskeletal system: Secondary | ICD-10-CM | POA: Diagnosis not present

## 2020-08-20 DIAGNOSIS — M545 Low back pain, unspecified: Secondary | ICD-10-CM | POA: Diagnosis not present

## 2020-08-20 DIAGNOSIS — Z7409 Other reduced mobility: Secondary | ICD-10-CM | POA: Diagnosis not present

## 2020-08-20 DIAGNOSIS — Z789 Other specified health status: Secondary | ICD-10-CM | POA: Diagnosis not present

## 2020-08-20 DIAGNOSIS — M533 Sacrococcygeal disorders, not elsewhere classified: Secondary | ICD-10-CM | POA: Diagnosis not present

## 2020-09-26 DIAGNOSIS — R112 Nausea with vomiting, unspecified: Secondary | ICD-10-CM | POA: Diagnosis not present

## 2020-09-26 DIAGNOSIS — K529 Noninfective gastroenteritis and colitis, unspecified: Secondary | ICD-10-CM | POA: Diagnosis not present

## 2020-11-11 ENCOUNTER — Other Ambulatory Visit: Payer: Self-pay | Admitting: Internal Medicine

## 2020-11-11 DIAGNOSIS — S2001XA Contusion of right breast, initial encounter: Secondary | ICD-10-CM

## 2020-11-11 DIAGNOSIS — R921 Mammographic calcification found on diagnostic imaging of breast: Secondary | ICD-10-CM

## 2020-11-11 DIAGNOSIS — R928 Other abnormal and inconclusive findings on diagnostic imaging of breast: Secondary | ICD-10-CM

## 2020-12-04 ENCOUNTER — Ambulatory Visit
Admission: RE | Admit: 2020-12-04 | Discharge: 2020-12-04 | Disposition: A | Discharge status: Home / Self Care | Payer: PPO | Source: Ambulatory Visit | Attending: Internal Medicine | Admitting: Internal Medicine

## 2020-12-04 ENCOUNTER — Other Ambulatory Visit: Payer: Self-pay | Admitting: Internal Medicine

## 2020-12-04 ENCOUNTER — Ambulatory Visit: Payer: PPO

## 2020-12-04 ENCOUNTER — Other Ambulatory Visit: Payer: Self-pay

## 2020-12-04 DIAGNOSIS — R921 Mammographic calcification found on diagnostic imaging of breast: Secondary | ICD-10-CM

## 2020-12-04 DIAGNOSIS — N644 Mastodynia: Secondary | ICD-10-CM

## 2020-12-09 DIAGNOSIS — Z6823 Body mass index (BMI) 23.0-23.9, adult: Secondary | ICD-10-CM | POA: Diagnosis not present

## 2020-12-09 DIAGNOSIS — Z124 Encounter for screening for malignant neoplasm of cervix: Secondary | ICD-10-CM | POA: Diagnosis not present

## 2020-12-09 DIAGNOSIS — N959 Unspecified menopausal and perimenopausal disorder: Secondary | ICD-10-CM | POA: Diagnosis not present

## 2020-12-09 DIAGNOSIS — Z01419 Encounter for gynecological examination (general) (routine) without abnormal findings: Secondary | ICD-10-CM | POA: Diagnosis not present

## 2020-12-13 ENCOUNTER — Ambulatory Visit
Admission: RE | Admit: 2020-12-13 | Discharge: 2020-12-13 | Disposition: A | Discharge status: Home / Self Care | Payer: PPO | Source: Ambulatory Visit | Attending: Internal Medicine | Admitting: Internal Medicine

## 2020-12-13 ENCOUNTER — Other Ambulatory Visit: Payer: Self-pay

## 2020-12-13 DIAGNOSIS — N6489 Other specified disorders of breast: Secondary | ICD-10-CM | POA: Diagnosis not present

## 2020-12-13 DIAGNOSIS — R921 Mammographic calcification found on diagnostic imaging of breast: Secondary | ICD-10-CM

## 2021-01-15 ENCOUNTER — Ambulatory Visit: Payer: Self-pay

## 2021-01-15 ENCOUNTER — Other Ambulatory Visit: Payer: Self-pay

## 2021-01-15 ENCOUNTER — Ambulatory Visit: Payer: PPO | Admitting: Family Medicine

## 2021-01-15 ENCOUNTER — Encounter: Payer: Self-pay | Admitting: Family Medicine

## 2021-01-15 VITALS — BP 120/86 | HR 84 | Ht 62.0 in | Wt 128.0 lb

## 2021-01-15 DIAGNOSIS — M533 Sacrococcygeal disorders, not elsewhere classified: Secondary | ICD-10-CM | POA: Diagnosis not present

## 2021-01-15 MED ORDER — GABAPENTIN 100 MG PO CAPS: 200.0000 mg | ORAL_CAPSULE | Freq: Every day | ORAL | 0 refills | Status: DC

## 2021-01-15 NOTE — Patient Instructions (Signed)
MRI pelvis [id number] Gabapentin '200mg'$  at night Continue Vit D Continue all other modalities Once we have imaging will discuss

## 2021-01-15 NOTE — Assessment & Plan Note (Signed)
Patient has had this pain for nearly 2 years at this time.  Patient states if anything seems to be somewhat worsening.  Has done formal physical therapy, icing, over-the-counter medications, patient tries to avoid anti-inflammatories secondary to the factor V Leiden.  At this point we will start on gabapentin for any type of neurologic compromise that could be contributing.  In addition to this I do feel that 2 years and failing conservative therapy is worth an MRI.  Patient will have an MRI ordered today to further evaluate for any occult fracture that could be contributing.  After imaging we will discuss further treatment options.

## 2021-01-15 NOTE — Progress Notes (Signed)
[place] Phone: [phone] Subjective:   Kayla Kayla [name], am serving as a scribe for Dr. Hulan Saas.  This visit occurred during the SARS-CoV-2 public health emergency.  Safety protocols were in place, including screening questions prior to the visit, additional usage of staff PPE, and extensive cleaning of exam room while observing appropriate contact time as indicated for disinfecting solutions.   I'm seeing this patient by the request  of:  Kayla Kayla [name]., MD  CC: Tailbone pain  [id number]  Kayla Kayla [name] is a 70 y.o. female coming in with complaint of coccyx pain. Patient states that she sat on garden chair for 4 hours and she got into hot tub and slipped off of a pool noodle and then went for a walk and a water bottle was hitting against this area.   Has tried ice, accupunture, Physical therapy, rolfing. Pain persistent for past 2 years. Unable to take IBU due to blood clot in eye. Pain with seated to standing position. Laying in bed in all positions hurts. Has had xrays [facility].  X-ray report states that there is some left sided sacroiliac arthritis but otherwise fairly unremarkable.  This included the lumbar spine and the pelvis.  Reviewing patient's chart patient has been seen by physical therapy multiple visits since January.  Kayla [name] past medical history on file. Past Surgical History:  Procedure Laterality Date   APPENDECTOMY     age 55   FOOT SURGERY Bilateral    TONSILLECTOMY     age 47   Social History   Socioeconomic History   Marital status: Married    Spouse name: Not on file   Number of children: 2   Years of education: Not on file   Highest education level: Not on file  Occupational History   Occupation: Warehouse manager: [employer]  Tobacco Use   Smoking status: Never   Smokeless tobacco: Never  Vaping Use   Vaping Use: Never  used  Substance and Sexual Activity   Alcohol use: Kayla [name]   Drug use: Kayla [name]   Sexual activity: Not on file  Other Topics Concern   Not on file  Social History Narrative   Not on file   Social Determinants of Health   Financial Resource Strain: Not on file  Food Insecurity: Not on file  Transportation Needs: Not on file  Physical Activity: Not on file  Stress: Not on file  Social Connections: Not on file   Allergies  Allergen Reactions   Codeine     "jittery"   Family History  Problem Relation Age of Onset   Congestive Heart Failure Father    Other Mother        brain tumors-benign   Colon cancer Neg Hx        Current Outpatient Medications (Analgesics):    aspirin 81 MG tablet, Take 81 mg by mouth daily.   ibuprofen (ADVIL,MOTRIN) 200 MG tablet, Take 400 mg by mouth as needed for pain.   Current Outpatient Medications (Other):    Ascorbic Acid (VITAMIN C PO), Take 1 capsule by mouth daily.   B Complex Vitamins (VITAMIN-B COMPLEX) TABS, Take 1 tablet by mouth daily.   Docusate Calcium (STOOL SOFTENER PO), Take 1 capsule by mouth daily.   fish oil-omega-3 fatty acids 1000 MG capsule, Take 2 g by mouth daily.   gabapentin (NEURONTIN) 100 MG capsule, Take 2 capsules (  200 mg total) by mouth at bedtime.   MULTIPLE VITAMIN PO, Take 1 tablet by mouth daily.   Polyethylene Glycol POWD, 1 g by Does not apply route daily.   Reviewed prior external information including notes and imaging from  primary care provider As well as notes that were available from care everywhere and other healthcare systems.  Past medical history, social, surgical and family history all reviewed in electronic medical record.  Kayla [name] pertanent information unless stated regarding to the chief complaint.   Review of Systems:  Kayla [name] headache, visual changes, nausea, vomiting, diarrhea, constipation, dizziness, , skin rash, fevers, chills, night sweats, weight loss, swollen lymph nodes, body aches, joint swelling,  chest pain, shortness of breath, mood changes. POSITIVE muscle aches Patient states that she does have chronic abdominal pain with constipation but Kayla [name] changes over the course of the last 2 years.  Patient has had a regular colonoscopy within the last 5 years.  Objective  Blood pressure 120/86, pulse 84, height '5\' 2"'$  (1.575 m), weight 128 lb (58.1 kg), SpO2 96 %.   General: Kayla [name] apparent distress alert and oriented x3 mood and affect normal, dressed appropriately.  HEENT: Pupils equal, extraocular movements intact  Respiratory: Patient's speak in full sentences and does not appear short of breath  Cardiovascular: Kayla [name] lower extremity edema, non tender, Kayla [name] erythema  Gait normal with good balance and coordination.  MSK: Low back exam shows very mild loss of lordosis.  Mild tenderness over the sacroiliac joint.  Negative straight leg test but patient does have his discomfort more on the left side.  Patient is severely tender just to the left of the coccyx.  Questionable callus formation noted over the bone.  Severely tender in the area.  Approximately 1 cm proximal to the inferior tip patient has significant voluntary and involuntary guarding noted.  Kayla [name] skin breakdown noted.    Impression and Recommendations:     The above documentation has been reviewed and is accurate and complete [name], DO

## 2021-01-17 ENCOUNTER — Ambulatory Visit
Admission: RE | Admit: 2021-01-17 | Discharge: 2021-01-17 | Disposition: A | Discharge status: Home / Self Care | Payer: PPO | Source: Ambulatory Visit | Attending: Family Medicine | Admitting: Family Medicine

## 2021-01-17 ENCOUNTER — Other Ambulatory Visit: Payer: Self-pay

## 2021-01-17 DIAGNOSIS — M47816 Spondylosis without myelopathy or radiculopathy, lumbar region: Secondary | ICD-10-CM | POA: Diagnosis not present

## 2021-01-17 DIAGNOSIS — M533 Sacrococcygeal disorders, not elsewhere classified: Secondary | ICD-10-CM

## 2021-01-17 DIAGNOSIS — S3992XA Unspecified injury of lower back, initial encounter: Secondary | ICD-10-CM | POA: Diagnosis not present

## 2021-01-21 ENCOUNTER — Telehealth: Payer: Self-pay | Admitting: Family Medicine

## 2021-01-21 NOTE — Telephone Encounter (Signed)
Pt called to advise she would not be continuing the Gabapentin. She took 1 pill, 2 nights in a row but felt extreme nausea.  Unsure if we have an alternative recommendation or if she needed a follow up appt.

## 2021-01-22 NOTE — Telephone Encounter (Signed)
Sent patient MyChart message with recommendations.  

## 2021-02-11 NOTE — Progress Notes (Signed)
[facility] [place] Phone: [phone] Subjective:   Kayla [name], am serving as a scribe for Dr. Hulan Saas.  I'm seeing this patient by the request  of:  [name]., MD  CC: Back pain follow-up  [id number]  01/15/2021 Patient has had this pain for nearly 2 years at this time.  Patient states if anything seems to be somewhat worsening.  Has done formal physical therapy, icing, over-the-counter medications, patient tries to avoid anti-inflammatories secondary to the factor V Leiden.  At this point we will start on gabapentin for any type of neurologic compromise that could be contributing.  In addition to this I do feel that 2 years and failing conservative therapy is worth an MRI.  Patient will have an MRI ordered today to further evaluate for any occult fracture that could be contributing.  After imaging we will discuss further treatment options.  Update 02/12/2021 Satia Douthitt [place]i is a 70 y.o. female coming in with complaint of coccyx pain. Patient states that there is no change in the pain still using a pillow and ice to sit on to help with the pain. Patient had to stop the gabapentin for bad side effects .  MRI 01/17/2021 pelvis   IMPRESSION: Tarlov cysts are seen in the sacral segments heart doubtful clinical significance. The pelvis is otherwise negative.   Mild to moderate facet degenerative disease lower lumbar spine.    History reviewed. No pertinent past medical history. Past Surgical History:  Procedure Laterality Date   APPENDECTOMY     age 3   FOOT SURGERY Bilateral    TONSILLECTOMY     age 41   Social History   Socioeconomic History   Marital status: Married    Spouse name: Not on file   Number of children: 2   Years of education: Not on file   Highest education level: Not on file  Occupational History   Occupation: Warehouse manager: [employer]   Tobacco Use   Smoking status: Never   Smokeless tobacco: Never  Vaping Use   Vaping Use: Never used  Substance and Sexual Activity   Alcohol use: No   Drug use: No   Sexual activity: Not on file  Other Topics Concern   Not on file  Social History Narrative   Not on file   Social Determinants of Health   Financial Resource Strain: Not on file  Food Insecurity: Not on file  Transportation Needs: Not on file  Physical Activity: Not on file  Stress: Not on file  Social Connections: Not on file   Allergies  Allergen Reactions   Codeine     "jittery"   Gabapentin Nausea Only, Anxiety and Other (See Comments)    Jittery/hallucinations    Family History  Problem Relation Age of Onset   Congestive Heart Failure Father    Other Mother        brain tumors-benign   Colon cancer Neg Hx        Current Outpatient Medications (Analgesics):    aspirin 81 MG tablet, Take 81 mg by mouth daily.   ibuprofen (ADVIL,MOTRIN) 200 MG tablet, Take 400 mg by mouth as needed for pain.   Current Outpatient Medications (Other):    Ascorbic Acid (VITAMIN C PO), Take 1 capsule by mouth daily.   B Complex Vitamins (VITAMIN-B COMPLEX) TABS, Take 1 tablet by mouth daily.   Docusate Calcium (STOOL SOFTENER  PO), Take 1 capsule by mouth daily.   fish oil-omega-3 fatty acids 1000 MG capsule, Take 2 g by mouth daily.   MULTIPLE VITAMIN PO, Take 1 tablet by mouth daily.   PARoxetine (PAXIL) 10 MG tablet, Take 10 mg by mouth daily.   Polyethylene Glycol POWD, 1 g by Does not apply route daily.   gabapentin (NEURONTIN) 100 MG capsule, Take 2 capsules (200 mg total) by mouth at bedtime. (Patient not taking: Reported on 02/12/2021)   Reviewed prior external information including notes and imaging from  primary care provider As well as notes that were available from care everywhere and other healthcare systems.  Past medical history, social, surgical and family history all reviewed in electronic  medical record.  No pertanent information unless stated regarding to the chief complaint.   Review of Systems:  No headache, visual changes, nausea, vomiting, diarrhea, constipation, dizziness, abdominal pain, skin rash, fevers, chills, night sweats, weight loss, swollen lymph nodes, body aches, joint swelling, chest pain, shortness of breath, mood changes. POSITIVE muscle aches  Objective  Blood pressure 104/68, pulse 79, height '5\' 2"'$  (1.575 m), SpO2 96 %.   General: No apparent distress alert and oriented x3 mood and affect normal, dressed appropriately.  HEENT: Pupils equal, extraocular movements intact  Respiratory: Patient's speak in full sentences and does not appear short of breath  Cardiovascular: No lower extremity edema, non tender, no erythema  Gait normal with good balance and coordination.  MSK:   low back exam does show the patient does have some mild loss of lordosis.  Patient does have tenderness to palpation in the paraspinal musculature of the lumbar spine that does seem to be worse with extension.  Pain over the coccyx area as well. Patient does have mild tightness noted with FABER test bilaterally.  Osteopathic findings T11 flexed rotated and side bent left L4 flexed rotated and side bent right Sacrum right on right   Impression and Recommendations:     The above documentation has been reviewed and is accurate and complete [name], DO

## 2021-02-12 ENCOUNTER — Encounter: Payer: Self-pay | Admitting: Family Medicine

## 2021-02-12 ENCOUNTER — Other Ambulatory Visit: Payer: Self-pay

## 2021-02-12 ENCOUNTER — Ambulatory Visit: Payer: PPO | Admitting: Family Medicine

## 2021-02-12 ENCOUNTER — Ambulatory Visit (INDEPENDENT_AMBULATORY_CARE_PROVIDER_SITE_OTHER): Payer: PPO

## 2021-02-12 VITALS — BP 104/68 | HR 79 | Ht 62.0 in

## 2021-02-12 DIAGNOSIS — M9902 Segmental and somatic dysfunction of thoracic region: Secondary | ICD-10-CM | POA: Diagnosis not present

## 2021-02-12 DIAGNOSIS — M545 Low back pain, unspecified: Secondary | ICD-10-CM | POA: Diagnosis not present

## 2021-02-12 DIAGNOSIS — M2550 Pain in unspecified joint: Secondary | ICD-10-CM | POA: Diagnosis not present

## 2021-02-12 DIAGNOSIS — M533 Sacrococcygeal disorders, not elsewhere classified: Secondary | ICD-10-CM | POA: Diagnosis not present

## 2021-02-12 DIAGNOSIS — N8189 Other female genital prolapse: Secondary | ICD-10-CM | POA: Diagnosis not present

## 2021-02-12 DIAGNOSIS — M9903 Segmental and somatic dysfunction of lumbar region: Secondary | ICD-10-CM | POA: Diagnosis not present

## 2021-02-12 DIAGNOSIS — M9904 Segmental and somatic dysfunction of sacral region: Secondary | ICD-10-CM | POA: Diagnosis not present

## 2021-02-12 LAB — COMPREHENSIVE METABOLIC PANEL
ALT: 11 U/L (ref 0–35)
AST: 18 U/L (ref 0–37)
Albumin: 4.3 g/dL (ref 3.5–5.2)
Alkaline Phosphatase: 57 U/L (ref 39–117)
BUN: 21 mg/dL (ref 6–23)
CO2: 26 mEq/L (ref 19–32)
Calcium: 9.5 mg/dL (ref 8.4–10.5)
Chloride: 105 mEq/L (ref 96–112)
Creatinine, Ser: 0.88 mg/dL (ref 0.40–1.20)
GFR: 67.81 mL/min (ref >60.00)
Glucose, Bld: 97 mg/dL (ref 70–99)
Potassium: 3.9 mEq/L (ref 3.5–5.1)
Sodium: 140 mEq/L (ref 135–145)
Total Bilirubin: 0.6 mg/dL (ref 0.2–1.2)
Total Protein: 6.5 g/dL (ref 6.0–8.3)

## 2021-02-12 LAB — CBC WITH DIFFERENTIAL/PLATELET
Basophils Absolute: 0 10*3/uL (ref 0.0–0.1)
Basophils Relative: 0.4 % (ref 0.0–3.0)
Eosinophils Absolute: 0.3 10*3/uL (ref 0.0–0.7)
Eosinophils Relative: 3.8 % (ref 0.0–5.0)
HCT: 42 % (ref 36.0–46.0)
Hemoglobin: 14.3 g/dL (ref 12.0–15.0)
Lymphocytes Relative: 18.3 % (ref 12.0–46.0)
Lymphs Abs: 1.2 10*3/uL (ref 0.7–4.0)
MCHC: 34.1 g/dL (ref 30.0–36.0)
MCV: 94.6 fl (ref 78.0–100.0)
Monocytes Absolute: 0.5 10*3/uL (ref 0.1–1.0)
Monocytes Relative: 7 % (ref 3.0–12.0)
Neutro Abs: 4.8 10*3/uL (ref 1.4–7.7)
Neutrophils Relative %: 70.5 % (ref 43.0–77.0)
Platelets: 191 10*3/uL (ref 150.0–400.0)
RBC: 4.43 Mil/uL (ref 3.87–5.11)
RDW: 12.8 % (ref 11.5–15.5)
WBC: 6.8 10*3/uL (ref 4.0–10.5)

## 2021-02-12 LAB — IBC PANEL
Iron: 122 ug/dL (ref 42–145)
Saturation Ratios: 34.9 % (ref 20.0–50.0)
TIBC: 350 ug/dL (ref 250.0–450.0)
Transferrin: 250 mg/dL (ref 212.0–360.0)

## 2021-02-12 LAB — SEDIMENTATION RATE: Sed Rate: 7 mm/hr (ref 0–30)

## 2021-02-12 LAB — T4, FREE: Free T4: 0.79 ng/dL (ref 0.60–1.60)

## 2021-02-12 LAB — TSH: TSH: 2.85 u[IU]/mL (ref 0.35–5.50)

## 2021-02-12 LAB — T3, FREE: T3, Free: 2.8 pg/mL (ref 2.3–4.2)

## 2021-02-12 LAB — FERRITIN: Ferritin: 25.1 ng/mL (ref 10.0–291.0)

## 2021-02-12 LAB — URIC ACID: Uric Acid, Serum: 3.5 mg/dL (ref 2.4–7.0)

## 2021-02-12 LAB — C-REACTIVE PROTEIN: CRP: <1 mg/dL (ref 0.5–20.0)

## 2021-02-12 NOTE — Assessment & Plan Note (Signed)

## 2021-02-12 NOTE — Assessment & Plan Note (Signed)
Patient continues to have the coccyx pain.  Patient continues to have the pain and seems to be out of proportion.  MRI of the pelvis was unremarkable for this area.  We will get x-rays of the lumbar spine.  Attempted osteopathic manipulation to see how patient will potentially respond.  Patient was unable to tolerate the gabapentin and we discussed laboratory work-up to further evaluate for anything else that could be contributing to some of the discomfort and pain.  Follow-up with me again 6 weeks if the osteopathic manipulation is beneficial as well.

## 2021-02-12 NOTE — Patient Instructions (Addendum)
Good to see you  PT at [facility] for pelvic floor therapy X ray on the way out Labs on the way out Ask your doc about possibly Cymbalta as a medicine for pain Tried manipulation today See me again in 5-7 weeks

## 2021-02-17 LAB — PTH, INTACT AND CALCIUM
Calcium: 9.6 mg/dL (ref 8.6–10.4)
PTH: 27 pg/mL (ref 16–77)

## 2021-02-17 LAB — VITAMIN D 1,25 DIHYDROXY
Vitamin D 1, 25 (OH)2 Total: 46 pg/mL (ref 18–72)
Vitamin D2 1, 25 (OH)2: <8 pg/mL
Vitamin D3 1, 25 (OH)2: 46 pg/mL

## 2021-02-17 LAB — ANA: Anti Nuclear Antibody (ANA): NEGATIVE

## 2021-02-17 LAB — ACETAMINOPHEN LEVEL: Acetaminophen (Tylenol), Serum: <10 mg/L — ABNORMAL LOW (ref 10–20)

## 2021-02-17 LAB — RHEUMATOID FACTOR: Rheumatoid fact SerPl-aCnc: <14 IU/mL (ref <14)

## 2021-02-17 LAB — CALCIUM, IONIZED: Calcium, Ion: 5.09 mg/dL (ref 4.8–5.6)

## 2021-02-17 LAB — CYCLIC CITRUL PEPTIDE ANTIBODY, IGG: Cyclic Citrullin Peptide Ab: <16 UNITS

## 2021-02-18 ENCOUNTER — Ambulatory Visit: Payer: PPO | Attending: Family Medicine | Admitting: Physical Therapy

## 2021-02-18 ENCOUNTER — Encounter: Payer: Self-pay | Admitting: Physical Therapy

## 2021-02-18 ENCOUNTER — Ambulatory Visit: Payer: PPO | Admitting: Physical Therapy

## 2021-02-18 ENCOUNTER — Other Ambulatory Visit: Payer: Self-pay

## 2021-02-18 DIAGNOSIS — M6281 Muscle weakness (generalized): Secondary | ICD-10-CM | POA: Insufficient documentation

## 2021-02-18 DIAGNOSIS — R252 Cramp and spasm: Secondary | ICD-10-CM | POA: Diagnosis not present

## 2021-02-18 DIAGNOSIS — R279 Unspecified lack of coordination: Secondary | ICD-10-CM | POA: Diagnosis not present

## 2021-02-18 NOTE — Patient Instructions (Signed)
Lubrication Used for intercourse to reduce friction Avoid ones that have glycerin, warming gels, tingling gels, icing or cooling gel, scented Avoid parabens due to a preservative similar to female sex hormone May need to be reapplied once or several times during sexual activity Can be applied to both partners genitals prior to vaginal penetration to minimize friction or irritation Prevent irritation and mucosal tears that cause post coital pain and increased the risk of vaginal and urinary tract infections Oil-based lubricants cannot be used with condoms due to breaking them down.  Least likely to irritate vaginal tissue.  Plant based-lubes are safe Silicone-based lubrication are thicker and last long and used for post-menopausal women  Vaginal Lubricators Here is a list of some suggested lubricators you can use for intercourse. Use the most hypoallergenic product.  You can place on you or your partner.  Slippery Stuff ( water based) Sylk or Sliquid Natural H2O ( good  if frequent UTI's)- walmart, amazon Sliquid organics silk-(aloe and silicone based ) [employer] (www.blossom-organics.com)- (aloe based ) Coconut oil, olive oil -not good with condoms  PJur Woman Nude- (water based) amazon Uberlube- ( silicon) Humphreys has an organic one Yes lubricant- (water based and has plant oil based similar to silicone) [employer] Platinum-Silicone, Target, [facility] Olive and Bee intimate cream-  www.oliveandbee.com.au Pink - [employer] stuff Erosense Sync- walmart, amazon Coconu- FailLinks.co.uk  Things to avoid in lubricants are glycerin, warming gels, tingling gels, icing or cooling  gels, and scented gels.  Also avoid Vaseline. KY jelly, Replens, and Astroglide contain chlorhexidine which kills good bacteria(lactobacilli)  Things to avoid in the vaginal area Do not use things to irritate the vulvar area No lotions- see below Soaps you  can use :Aveeno, Calendula, Good  Clean Love cleanser if needed. Must be gentle No deodorants No douches Good to sleep without underwear to let the vaginal area to air out No scrubbing: spread the lips to let warm water rinse over labias and pat dry  Creams that can be used on the Vulva Area V [employer], [place]ett MoonMaid Botanical Pro-Meno Wild Yam Cream Coconut oil, olive oil Cleo by [employer] labial moisturizer -Amazon,  [place] Releveum ( lidocaine) or [place] [employer] Yes Moisturizer   ' Moisturizers They are used in the vagina to hydrate the mucous membrane that make up the vaginal canal. Designed to keep a more normal acid balance (ph) Once placed in the vagina, it will last between two to three days.  Use 2-3 times per week at bedtime  Ingredients to avoid is glycerin and fragrance, can increase chance of infection Should not be used just before sex due to causing irritation Most are gels administered either in a tampon-shaped applicator or as a vaginal suppository. They are non-hormonal.   Types of Moisturizers(internal use)  Vitamin E vaginal suppositories- Whole foods, Amazon Moist Again Coconut oil- can break down condoms Julva- (Do no use if on Tamoxifen) amazon Yes moisturizer- amazon NeuEve Silk , NeuEve Silver for menopausal or over 65 (if have severe vaginal atrophy or cancer treatments use NeuEve Silk for  1 month than move to [employer])- [employer], [place] Olive and Bee intimate cream- www.oliveandbee.com.au Mae vaginal moisturizer- Amazon Aloe    Creams to use externally on the Vulva area Albertson's (good for for cancer patients that had radiation to the area)- [place] or [employer] Corporation.FlyingBasics.com.br V-magic cream - amazon Julva-amazon Vital "V Wild Yam salve ( help  moisturize and help with thinning vulvar area, does have Hydro by Irwin Brakeman labial  moisturizer (Amazon,  Coconut or olive oil aloe   Things to avoid in the vaginal area Do not use things to irritate the vulvar area No lotions just specialized creams for the vulva area- Neogyn, V-magic, No soaps; can use Aveeno or Calendula cleanser if needed. Must be gentle No deodorants No douches Good to sleep without underwear to let the vaginal area to air out No scrubbing: spread the lips to let warm water rinse over labias and pat dry

## 2021-02-18 NOTE — Therapy (Signed)
[facility] [address] [place], [place], [address] Phone: [phone]   Fax:  [phone]  Physical Therapy Evaluation  Patient Details  Name: Kayla [name] MRN: [id number] Date of Birth: 1950-10-11 Referring Provider (PT): [name], DO   Encounter Date: 02/18/2021   PT End of Session - 02/18/21 1632     Visit Number 1    Date for PT Re-Evaluation 06/20/21    Authorization Type health team    PT Start Time [id number]    PT Stop Time [id number]    PT Time Calculation (min) 42 min    Activity Tolerance Patient tolerated treatment well;Patient limited by pain    Behavior During Therapy [facility] for tasks assessed/performed             History reviewed. No pertinent past medical history.  Past Surgical History:  Procedure Laterality Date   APPENDECTOMY     age 70   FOOT SURGERY Bilateral    TONSILLECTOMY     age 70    There were no vitals filed for this visit.    Subjective Assessment - 02/18/21 1541     Subjective Pt reports a 2 year history of Lt sided coccyx pain after falling in hot tub on first step on coccyx. Pt reports pain with sitting and sidelying and sitting on ice helps.    How long can you sit comfortably? 30 mins with ice pack over pillow/memory foam at coccyx    How long can you stand comfortably? no limits    How long can you walk comfortably? no limits    Currently in Pain? Yes    Pain Score 6     Pain Location Coccyx    Pain Orientation Left    Pain Descriptors / Indicators Burning    Pain Type Chronic pain    Pain Radiating Towards no    Pain Onset More than a month ago    Pain Frequency Constant   with sitting or sidelying   Aggravating Factors  sitting and sidelying    Pain Relieving Factors standing, walking, ice pack prolonges time tolerated in sitting                [facility] PT Assessment - 02/18/21 0001       Assessment   Medical Diagnosis M53.3 (ICD-10-CM) -  Coccyx pain  N81.89 (ICD-10-CM) - Weakness of pelvic floor    Referring Provider (PT) [name], DO    Onset Date/Surgical Date --   2 years   Prior Therapy previous PT for Lt knee and back pain      Precautions   Precautions None      Restrictions   Weight Bearing Restrictions No      Balance Screen   Has the patient fallen in the past 6 months No    Has the patient had a decrease in activity level because of a fear of falling?  No    Is the patient reluctant to leave their home because of a fear of falling?  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      Cognition   Overall Cognitive Status Within Functional Limits for tasks assessed      Sensation   Light Touch Appears Intact      Coordination   Gross Motor Movements are Fluid and Coordinated Yes    Fine Motor Movements are Fluid and Coordinated Yes      Posture/Postural Control   Posture/Postural Control Postural limitations    Postural Limitations Rounded Shoulders;Posterior pelvic tilt      ROM / Strength   AROM / PROM / Strength AROM;Strength      AROM   Overall AROM Comments decreased range in bil thoracic and lumbar side bending      Strength   Overall Strength Comments bil hips 3+/5 in all directions except 3/5 in bil hip extension      Flexibility   Soft Tissue Assessment /Muscle Length yes   decreased in bil adductors and hamstrings by 25%     Palpation   SI assessment  mild TTP at Lt>Rt    Palpation comment TTP at coccyx and Lt medial glute                        Objective measurements completed on  examination: See above findings.     Pelvic Floor Special Questions - 02/18/21 0001     Prior Pelvic/Prostate Exam Yes    Are you Pregnant or attempting pregnancy? No    Prior Pregnancies Yes    Number of Pregnancies 2    Number of Vaginal Deliveries 2    Any difficulty with labor and deliveries Yes   tearing with first child and induction done   Currently Sexually Active No   reports severe dryness&tightness.reports prior to coccyx pain,she felt she had issues with dryness and tightness and purchased vaginal dilators,increased sizing tolerance however since pain at coccyx unable to tolerate finger size of vaginal penetration.   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    Caffeine beverages no    Falling out feeling (prolapse) No    Pelvic Floor Internal Exam deferred                      PT Education - 02/18/21 1632     Education Details Pt educated on exam findings, vaginal lubricants, mositurizers and handouts given, HEP, POC    Person(s) Educated Patient    Methods Explanation;Demonstration;Tactile cues;Verbal cues;Handout    Comprehension Verbalized understanding;Returned demonstration              PT Short Term Goals - 02/18/21 1708       PT SHORT TERM GOAL #1   Title Pt to be I with HEP    Time 5    Period Weeks    Status New    Target Date 03/25/21      PT SHORT TERM GOAL #2   Title pt to report no more than6/10 pain at coccyx with sitting to increase tolerance to 30 mins without ice pack    Time 5    Period Weeks    Status New  Target Date 03/25/21      PT SHORT TERM GOAL #3   Title pt to demonstrate improved bil hip pain to at least 4/5 globally for improved functional mobility and decreased compensation    Time 5    Period  Weeks    Status New    Target Date 03/25/21      PT SHORT TERM GOAL #4   Title pt to tolerate vaginal dilator size 4 or equivalent for improved tolerance to vaginal penetration    Time 5    Period Weeks    Status New    Target Date 03/25/21               PT Long Term Goals - 02/18/21 1711       PT LONG TERM GOAL #1   Baseline pt to be I with advanced HEP    Time 4    Period Months    Status New    Target Date 06/20/21      PT LONG TERM GOAL #2   Title pt to tolerate vaginal dilator size 6 or equivalent for improved tolerance to vaginal penetration    Time 4    Period Months    Status New    Target Date 06/20/21      PT LONG TERM GOAL #3   Title pt to be I with self correction of proper breathing mechanics and core/pelvic floor contract/relaxation with mobility to decrease compensatory strategies    Time 4    Period Months    Status New    Target Date 06/20/21      PT LONG TERM GOAL #4   Title pt to demonstrate improved bil hip pain to at least 455 globally for improved functional mobility and decreased compensation    Time 4    Period Months    Status New    Target Date 06/20/21      PT LONG TERM GOAL #5   Title pt to report no more than 2/10 pain at coccyx with sitting to increase tolerance to 30 mins without ice pack    Time 4    Period Months    Status New    Target Date 06/20/21                    Plan - 02/18/21 1633     Clinical Impression Statement Pt is 70yo female presenting to clinic with referral for coccyx pain and weakness in pelvic floor with pt having a two yeat history of coccyx pain after slipping in hot tub onto her coccyx and hitting the first step and prolonged sitting after this. Pt has had x-rays and MRI 01/17/2021 pelvis which are unremarkable with Tarlov cysts are seen in the sacral segments heart doubtful clinical significance. The pelvis is otherwise negative per medical note. Pt also reports a chonric history of vaginal  dryness and irritation and pain with intercouse with previously pt told to purchase vaginal dilators and use of lubricant and was able to progress to largest size however then had injury and no longer able to tolerate vaginal penetration. Pt given handouts of lubrication and moisturizers and samples, PT went over them as well. Pt also educated on HEP and went over this as well. Pt found to have bil hip weakness in all directions, decreased flexibility in hips and spine with mild pain noted with side bending and rotation of spine in bil directions. Pt also had TTP at coccyx especially at  Lt side and Lt medial glute pain with palpation. Pt deferred internal exam until next visit but motivated to complete then. Pt agreed to all recommendation and denied questions at end of session. Pt would benefit from continued PT for improvement with deficits found on eval and pain.    Personal Factors and Comorbidities Time since onset of injury/illness/exacerbation;Comorbidity 2;Fitness    Comorbidities x2 vaginal births, tearing with one, and history of injury to coccyx with falling on it ~2 years ago    Examination-Activity Limitations Sit;Sleep    Examination-Participation Restrictions Interpersonal Relationship;Yard Work;Community Activity;Driving;Occupation    Stability/Clinical Decision Making Evolving/Moderate complexity    Clinical Decision Making Moderate    Rehab Potential Good    PT Frequency 2x / week    PT Duration Other (comment)   10 weeks   PT Treatment/Interventions ADLs/Self Care Home Management;Aquatic Therapy;Functional mobility training;Therapeutic exercise;Traction;Neuromuscular re-education;Manual techniques;Patient/family education;Scar mobilization;Passive range of motion;Dry needling;Energy conservation;Visual/perceptual remediation/compensation;Vasopneumatic Device;Taping;Spinal Manipulations;Joint Manipulations    PT Next Visit Plan go over HEP, internal exam    PT Home Exercise Plan Access  Code: [email or web address]    Consulted and Agree with Plan of Care Patient             Patient will benefit from skilled therapeutic intervention in order to improve the following deficits and impairments:  Decreased coordination, Increased fascial restricitons, Decreased endurance, Pain, Postural dysfunction, Improper body mechanics, Impaired flexibility, Decreased mobility, Decreased strength  Visit Diagnosis: Muscle weakness (generalized) - Plan: PT plan of care cert/re-cert  Lack of coordination - Plan: PT plan of care cert/re-cert  Cramp and spasm - Plan: PT plan of care cert/re-cert     Problem List Patient Active Problem List   Diagnosis Date Noted   Somatic dysfunction of spine, sacral 02/12/2021   Right leg swelling 05/02/2019   Coccyx pain 01/10/2019   Onychomycosis 01/10/2019   Palpitations 02/03/2017   Factor V Leiden ([place]) 02/03/2017   Left bundle branch block (LBBB) 02/03/2017   Vaginal atrophy 12/03/2016   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 [place], [place], [address] Phone: [phone]   Fax:  [phone]  Name: Walsie Reen Trumbull MRN: [id number] Date of Birth: 1950-07-31

## 2021-02-19 DIAGNOSIS — M4310 Spondylolisthesis, site unspecified: Secondary | ICD-10-CM | POA: Diagnosis not present

## 2021-02-19 DIAGNOSIS — N941 Unspecified dyspareunia: Secondary | ICD-10-CM | POA: Diagnosis not present

## 2021-02-19 DIAGNOSIS — N951 Menopausal and female climacteric states: Secondary | ICD-10-CM | POA: Diagnosis not present

## 2021-03-10 ENCOUNTER — Encounter (INDEPENDENT_AMBULATORY_CARE_PROVIDER_SITE_OTHER): Payer: PPO | Admitting: Ophthalmology

## 2021-03-10 ENCOUNTER — Other Ambulatory Visit: Payer: Self-pay

## 2021-03-10 DIAGNOSIS — H33302 Unspecified retinal break, left eye: Secondary | ICD-10-CM | POA: Diagnosis not present

## 2021-03-10 DIAGNOSIS — H2513 Age-related nuclear cataract, bilateral: Secondary | ICD-10-CM | POA: Diagnosis not present

## 2021-03-10 DIAGNOSIS — H43813 Vitreous degeneration, bilateral: Secondary | ICD-10-CM

## 2021-03-10 DIAGNOSIS — H348322 Tributary (branch) retinal vein occlusion, left eye, stable: Secondary | ICD-10-CM | POA: Diagnosis not present

## 2021-03-11 DIAGNOSIS — L821 Other seborrheic keratosis: Secondary | ICD-10-CM | POA: Diagnosis not present

## 2021-03-11 DIAGNOSIS — L720 Epidermal cyst: Secondary | ICD-10-CM | POA: Diagnosis not present

## 2021-03-11 DIAGNOSIS — L570 Actinic keratosis: Secondary | ICD-10-CM | POA: Diagnosis not present

## 2021-03-17 ENCOUNTER — Ambulatory Visit: Payer: PPO | Attending: Family Medicine | Admitting: Physical Therapy

## 2021-03-17 ENCOUNTER — Encounter: Payer: Self-pay | Admitting: Physical Therapy

## 2021-03-17 ENCOUNTER — Other Ambulatory Visit: Payer: Self-pay

## 2021-03-17 DIAGNOSIS — R2689 Other abnormalities of gait and mobility: Secondary | ICD-10-CM | POA: Insufficient documentation

## 2021-03-17 DIAGNOSIS — R279 Unspecified lack of coordination: Secondary | ICD-10-CM | POA: Insufficient documentation

## 2021-03-17 DIAGNOSIS — R252 Cramp and spasm: Secondary | ICD-10-CM | POA: Diagnosis not present

## 2021-03-17 DIAGNOSIS — M6281 Muscle weakness (generalized): Secondary | ICD-10-CM | POA: Diagnosis not present

## 2021-03-17 NOTE — Patient Instructions (Signed)
[email or web address]   

## 2021-03-17 NOTE — Therapy (Signed)
[facility] [address], [place], [place], [address] Phone: [phone]   Fax:  [phone]  Physical Therapy Treatment  Patient Details  Name: Kayla [name] MRN: [id number] Date of Birth: February 03, 1951 Referring Provider (PT): [name], DO   Encounter Date: 03/17/2021   PT End of Session - 03/17/21 1629     Visit Number 2    Date for PT Re-Evaluation 06/20/21    Authorization Type health team    PT Start Time 6283    PT Stop Time 1517    PT Time Calculation (min) 44 min    Activity Tolerance Patient tolerated treatment well;No increased pain    Behavior During Therapy [facility] for tasks assessed/performed             History reviewed. No pertinent past medical history.  Past Surgical History:  Procedure Laterality Date   APPENDECTOMY     age 62   FOOT SURGERY Bilateral    TONSILLECTOMY     age 26    There were no vitals filed for this visit.   Subjective Assessment - 03/17/21 1537     Subjective Pt reports some improvement with coccyx pain however pending level of activity and driving time. Does continue to need to sit on ice pack, worse with getting into to bed and lying on left side.                    No emotional/communication barriers or cognitive limitation. Patient is motivated to learn. Patient understands and agrees with treatment goals and plan. PT explains patient will be examined in standing, sitting, and lying down to see how their muscles and joints work. When they are ready, they will be asked to remove their underwear so PT can examine their perineum. The patient is also given the option of providing their own chaperone as one is not provided in our facility. The patient also has the right and is explained the right to defer or refuse any part of the evaluation or treatment including the internal exam. With the patient's consent, PT will use one gloved finger to gently  assess the muscles of the pelvic floor, seeing how well it contracts and relaxes and if there is muscle symmetry. After, the patient will get dressed and PT and patient will discuss exam findings and plan of care. PT and patient discuss plan of care, schedule, attendance policy and HEP activities.         Pelvic Floor Special Questions - 03/17/21 0001     External Perineal Exam WFL    Pelvic Floor Internal Exam patient identified and patient confirms consent for PT to perform internal soft tissue work and muscle strength and integrity assessment    Exam Type Rectal    Sensation WFL    Palpation TTP along Lt side of coccyx externally and internally: TTP at Lt Puborectalis, pubococcygeus, Iliococcygeus    Strength good squeeze, good lift, able to hold agaisnt strong resistance   however noted compensation at glutes   Tone increased               [facility] Adult PT Treatment/Exercise - 03/17/21 0001       Exercises   Exercises Lumbar      Lumbar Exercises: Stretches   Other Lumbar Stretch Exercise hip hinge stretch 2x30s with toes inward      Manual Therapy   Manual Therapy Soft tissue mobilization;Myofascial release    Manual  therapy comments manual work completed with internal pelvic floor assessment and mobilization treatment internally at rectum for coccyx mobility and decreased pain. Tightness and tenderness with palpation noted at Lt Puborectalis,pubococcygeus, Iliococcygeus and manual gentle stretch provided within pt's tolerance and consent for all treatment. Pt reported greatly decreased pain at end of session. Also manual sacral springing completed in prone with pt reporting no pain with this or post this.    Soft tissue mobilization Addaday gun used at bil gluteals and around base of coccyx for decreased tissure restrictions and decreased coccyx pain. good effect as at end of session pt able to sit on table without ice pack or cushion.                     PT  Education - 03/17/21 1620     Education Details Pt educated on pelvic floor relaxation, continued HEP and hip hinge stretch added.    Person(s) Educated Patient    Methods Explanation;Demonstration;Tactile cues;Verbal cues;Handout    Comprehension Verbalized understanding;Returned demonstration              PT Short Term Goals - 02/18/21 1708       PT SHORT TERM GOAL #1   Title Pt to be I with HEP    Time 5    Period Weeks    Status New    Target Date 03/25/21      PT SHORT TERM GOAL #2   Title pt to report no more than6/10 pain at coccyx with sitting to increase tolerance to 30 mins without ice pack    Time 5    Period Weeks    Status New    Target Date 03/25/21      PT SHORT TERM GOAL #3   Title pt to demonstrate improved bil hip pain to at least 4/5 globally for improved functional mobility and decreased compensation    Time 5    Period Weeks    Status New    Target Date 03/25/21      PT SHORT TERM GOAL #4   Title pt to tolerate vaginal dilator size 4 or equivalent for improved tolerance to vaginal penetration    Time 5    Period Weeks    Status New    Target Date 03/25/21               PT Long Term Goals - 02/18/21 1711       PT LONG TERM GOAL #1   Baseline pt to be I with advanced HEP    Time 4    Period Months    Status New    Target Date 06/20/21      PT LONG TERM GOAL #2   Title pt to tolerate vaginal dilator size 6 or equivalent for improved tolerance to vaginal penetration    Time 4    Period Months    Status New    Target Date 06/20/21      PT LONG TERM GOAL #3   Title pt to be I with self correction of proper breathing mechanics and core/pelvic floor contract/relaxation with mobility to decrease compensatory strategies    Time 4    Period Months    Status New    Target Date 06/20/21      PT LONG TERM GOAL #4   Title pt to demonstrate improved bil hip pain to at least 455 globally for improved functional mobility and decreased  compensation    Time  4    Period Months    Status New    Target Date 06/20/21      PT LONG TERM GOAL #5   Title pt to report no more than 2/10 pain at coccyx with sitting to increase tolerance to 30 mins without ice pack    Time 4    Period Months    Status New    Target Date 06/20/21                   Plan - 03/17/21 1629     Clinical Impression Statement Pt presents to clinic reported continued pain and constant need of ice packs and cushion anytime sitting down however some decreased pain levels in general with stretching given at eval per pt. Pt reports she also still has pain with Lt side lying but not right. Pt educated on internal rectal assessment to assess coccyx mobility or pelvic floor muscle tightness that may be contributing to pain. Pt consented and with rectal assessment pt found to have TTP and tightness at [facility], pubococcygeus, Iliococcygeus and extenally along Lt side of coccyx. Pt's focused on manual work internally for improved mobility at coccyx with gentle stretching at these muscles, and coccyx mobilization in flexion/extension lightly. Pt returned to sitting after assessment and reported greatly improved pain levels with ability to sit on table without cushion or ice pack for several minutes. Pt also directed in manual sacral springing in prone and Addaday gun used at bil gluteals for decreased tension as well. Pt also given pelvic floor relaxation handout for video at home and agreed to attempt. Pt would benefit from continued PT for improvement with deficits found on eval and pain.    Personal Factors and Comorbidities Time since onset of injury/illness/exacerbation;Comorbidity 2;Fitness    Comorbidities x2 vaginal births, tearing with one, and history of injury to coccyx with falling on it ~2 years ago    Examination-Activity Limitations Sit;Sleep    Examination-Participation Restrictions Interpersonal Relationship;Yard Work;Community  Activity;Driving;Occupation    Stability/Clinical Decision Making Evolving/Moderate complexity    Clinical Decision Making Moderate    Rehab Potential Good    PT Frequency 2x / week    PT Duration Other (comment)   10 weeks   PT Treatment/Interventions ADLs/Self Care Home Management;Aquatic Therapy;Functional mobility training;Therapeutic exercise;Traction;Neuromuscular re-education;Manual techniques;Patient/family education;Scar mobilization;Passive range of motion;Dry needling;Energy conservation;Visual/perceptual remediation/compensation;Vasopneumatic Device;Taping;Spinal Manipulations;Joint Manipulations    PT Next Visit Plan coccyx mobility, stretching    PT Home Exercise Plan Access Code: [email or web address]    Consulted and Agree with Plan of Care Patient             Patient will benefit from skilled therapeutic intervention in order to improve the following deficits and impairments:  Decreased coordination, Increased fascial restricitons, Decreased endurance, Pain, Postural dysfunction, Improper body mechanics, Impaired flexibility, Decreased mobility, Decreased strength  Visit Diagnosis: Other abnormalities of gait and mobility  Cramp and spasm  Muscle weakness (generalized)     Problem List Patient Active Problem List   Diagnosis Date Noted   Somatic dysfunction of spine, sacral 02/12/2021   Right leg swelling 05/02/2019   Coccyx pain 01/10/2019   Onychomycosis 01/10/2019   Palpitations 02/03/2017   Factor V Leiden ([place]) 02/03/2017   Left bundle branch block (LBBB) 02/03/2017   Vaginal atrophy 12/03/2016   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 [place], [place], [address] Phone: [phone]   Fax:  [phone]  Name: Chelsae Zanella Micciche MRN:  [id number] Date of Birth: [birth year]

## 2021-03-19 ENCOUNTER — Other Ambulatory Visit: Payer: Self-pay

## 2021-03-19 ENCOUNTER — Ambulatory Visit: Payer: PPO | Admitting: Physical Therapy

## 2021-03-19 DIAGNOSIS — R252 Cramp and spasm: Secondary | ICD-10-CM

## 2021-03-19 DIAGNOSIS — R279 Unspecified lack of coordination: Secondary | ICD-10-CM

## 2021-03-19 DIAGNOSIS — R2689 Other abnormalities of gait and mobility: Secondary | ICD-10-CM | POA: Diagnosis not present

## 2021-03-19 NOTE — Therapy (Signed)
[facility] [address], [place], [place], [address] Phone: [phone]   Fax:  [phone]  Physical Therapy Treatment  Patient Details  Name: Kayla [name] MRN: [id number] Date of Birth: 10/07/1950 Referring Provider (PT): [name], DO   Encounter Date: 03/19/2021   PT End of Session - 03/19/21 1527     Visit Number 3    Date for PT Re-Evaluation 06/20/21    Authorization Type health team    PT Start Time 1025    PT Stop Time 1525    PT Time Calculation (min) 40 min    Activity Tolerance Patient tolerated treatment well;No increased pain    Behavior During Therapy [facility] for tasks assessed/performed             No past medical history on file.  Past Surgical History:  Procedure Laterality Date   APPENDECTOMY     age 18   FOOT SURGERY Bilateral    TONSILLECTOMY     age 46    There were no vitals filed for this visit.   Subjective Assessment - 03/19/21 1444     Subjective Pt reports much less pain at coccyx with 4/10 sitting on hard surface leaning mildly forward but increases to 6/10 with leaning back, however before last week would be unable to sit without cushion and ice pack at all and then 8/10.    How long can you sit comfortably? 30 mins with ice pack over pillow/memory foam at coccyx    How long can you stand comfortably? no limits    How long can you walk comfortably? no limits    Currently in Pain? Yes    Pain Score 4     Pain Location Coccyx    Pain Orientation Left    Pain Descriptors / Indicators Burning                               [facility] Adult PT Treatment/Exercise - 03/19/21 0001       Self-Care   Self-Care Other Self-Care Comments    Other Self-Care Comments  Pt educated on voiding mechanics with squatty potty or step stool at home, breathing mechanics for voiding, and self mobilization at rectum as pt requested info if she wanted to attempt  at home if pain increased after working. Pt educated on how to complete, use of lubricant, and only to complete 1 time per week at this time if needed.      Manual Therapy   Manual Therapy Myofascial release    Manual therapy comments manual work completed with internal pelvic floor  mobilization treatment internally at rectum for coccyx mobility and decreased pain. Tightness and tenderness with palpation noted at Lt Puborectalis,pubococcygeus, Iliococcygeus and manual gentle stretch provided within pt's tolerance and consent for all treatment. Pt reported greatly decreased pain at end of session. Pt tolerated well and continued to have improved pain relief.                     PT Education - 03/19/21 1526     Education Details Pt educated on HEP, internal stretching/relaxation techniques, and voiding mechanics.    Person(s) Educated Patient    Methods Explanation;Demonstration;Tactile cues;Verbal cues    Comprehension Verbalized understanding;Returned demonstration              PT Short Term Goals - 02/18/21 1708  PT SHORT TERM GOAL #1   Title Pt to be I with HEP    Time 5    Period Weeks    Status New    Target Date 03/25/21      PT SHORT TERM GOAL #2   Title pt to report no more than6/10 pain at coccyx with sitting to increase tolerance to 30 mins without ice pack    Time 5    Period Weeks    Status New    Target Date 03/25/21      PT SHORT TERM GOAL #3   Title pt to demonstrate improved bil hip pain to at least 4/5 globally for improved functional mobility and decreased compensation    Time 5    Period Weeks    Status New    Target Date 03/25/21      PT SHORT TERM GOAL #4   Title pt to tolerate vaginal dilator size 4 or equivalent for improved tolerance to vaginal penetration    Time 5    Period Weeks    Status New    Target Date 03/25/21               PT Long Term Goals - 02/18/21 1711       PT LONG TERM GOAL #1   Baseline pt to be I  with advanced HEP    Time 4    Period Months    Status New    Target Date 06/20/21      PT LONG TERM GOAL #2   Title pt to tolerate vaginal dilator size 6 or equivalent for improved tolerance to vaginal penetration    Time 4    Period Months    Status New    Target Date 06/20/21      PT LONG TERM GOAL #3   Title pt to be I with self correction of proper breathing mechanics and core/pelvic floor contract/relaxation with mobility to decrease compensatory strategies    Time 4    Period Months    Status New    Target Date 06/20/21      PT LONG TERM GOAL #4   Title pt to demonstrate improved bil hip pain to at least 455 globally for improved functional mobility and decreased compensation    Time 4    Period Months    Status New    Target Date 06/20/21      PT LONG TERM GOAL #5   Title pt to report no more than 2/10 pain at coccyx with sitting to increase tolerance to 30 mins without ice pack    Time 4    Period Months    Status New    Target Date 06/20/21                   Plan - 03/19/21 1527     Clinical Impression Statement Pt presents to clinic reporting improved pain overall and now able to tolerate sitting with mild leaning forward without cushion or ice pack with 4/10 pain, 6/10 leaning back and was not able to tolerate this at all prior to mobilization last session and just sitting was 8/10. Pt reports she would like to learn how to do internal mobilization at home if needed, this was educated to pt with treatment this date and model used as well with technique, frequency of no more than 1x per week at this time, and safety with pressure technique talked about and demonstrated during session. Pt verbalized  understanding. Pt also educated on voiding and breathing mechanics and HEP. Pt session focused on education and manual work at  internal rectal treatment for improved mobility and stretching for decreased pain. Pt reported she does have difficulty with voiding with  straining and constipation history. Pt would benefit from continued PT for improvement with deficits found on eval and pain.    Personal Factors and Comorbidities Time since onset of injury/illness/exacerbation;Comorbidity 2;Fitness    Comorbidities x2 vaginal births, tearing with one, and history of injury to coccyx with falling on it ~2 years ago    Examination-Activity Limitations Sit;Sleep    Examination-Participation Restrictions Interpersonal Relationship;Yard Work;Community Activity;Driving;Occupation    Stability/Clinical Decision Making Evolving/Moderate complexity    Clinical Decision Making Moderate    Rehab Potential Good    PT Frequency 2x / week    PT Duration Other (comment)   10 weeks   PT Treatment/Interventions ADLs/Self Care Home Management;Aquatic Therapy;Functional mobility training;Therapeutic exercise;Traction;Neuromuscular re-education;Manual techniques;Patient/family education;Scar mobilization;Passive range of motion;Dry needling;Energy conservation;Visual/perceptual remediation/compensation;Vasopneumatic Device;Taping;Spinal Manipulations;Joint Manipulations    PT Next Visit Plan coccyx mobility, stretching    PT Home Exercise Plan Access Code: [email or web address]    Consulted and Agree with Plan of Care Patient             Patient will benefit from skilled therapeutic intervention in order to improve the following deficits and impairments:  Decreased coordination, Increased fascial restricitons, Decreased endurance, Pain, Postural dysfunction, Improper body mechanics, Impaired flexibility, Decreased mobility, Decreased strength  Visit Diagnosis: Lack of coordination  Cramp and spasm     Problem List Patient Active Problem List   Diagnosis Date Noted   Somatic dysfunction of spine, sacral 02/12/2021   Right leg swelling 05/02/2019   Coccyx pain 01/10/2019   Onychomycosis 01/10/2019   Palpitations 02/03/2017   Factor V Leiden ([place]) 02/03/2017   Left bundle  branch block (LBBB) 02/03/2017   Vaginal atrophy 12/03/2016   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 Patient is motivated to learn. Patient understands and agrees with treatment goals and plan. PT explains patient will be examined in standing, sitting, and lying down to see how their muscles and joints work. When they are ready, they will be asked to remove their underwear so PT can examine their perineum. The patient is also given the option of providing their own chaperone as one is not provided in our facility. The patient also has the right and is explained the right to defer or refuse any part of the evaluation or treatment including the internal exam. With the patient's consent, PT will use one gloved finger to gently assess the muscles of the pelvic floor, seeing how well it contracts and relaxes and if there is muscle symmetry. After, the patient will get dressed and PT and patient will discuss exam findings and plan of care. PT and patient discuss plan of care, schedule, attendance policy and HEP activities.    Stacy Gardner, PT, DPT 09/28/223:32 PM   [facility] [address] [place], [place], [address] Phone: [phone]   Fax:  [phone]  Name: Kayla [name] MRN: [id number] Date of Birth: 09-Jun-1951

## 2021-03-19 NOTE — Patient Instructions (Signed)
   [facility] [address], [address] [place], [place] [address] Phone # [phone] Fax [phone]  

## 2021-03-25 NOTE — Progress Notes (Signed)
Kayla [name] Phone: [phone] Subjective:   Fontaine No, am serving as a scribe for Dr. Hulan Saas. This visit occurred during the SARS-CoV-2 public health emergency.  Safety protocols were in place, including screening questions prior to the visit, additional usage of staff PPE, and extensive cleaning of exam room while observing appropriate contact time as indicated for disinfecting solutions.   I'm seeing this patient by the request  of:  Kayla [name]., MD  CC: Coccyx pain follow-up  OEV:[id number]  02/12/2021 Patient continues to have the coccyx pain.  Patient continues to have the pain and seems to be out of proportion.  MRI of the pelvis was unremarkable for this area.  We will get x-rays of the lumbar spine.  Attempted osteopathic manipulation to see how patient will potentially respond.  Patient was unable to tolerate the gabapentin and we discussed laboratory work-up to further evaluate for anything else that could be contributing to some of the discomfort and pain.  Follow-up with me again 6 weeks if the osteopathic manipulation is beneficial as well. OMT on 8/24  Updated 03/26/2021 Kayla [name] is a 70 y.o. female coming in with complaint of coccyx pain. Patient has been doing pelvic floor PT which is helping to decrease tightness and pain. Patient continues to use ice as well.  Patient does have arthritic changes of the back noted.  This was noted on x-ray.  Patient has been making some progress she feels that with the exercises.  Is taking the medications intermittently.  Xray IMPRESSION: Moderate degenerative changes of the facet joints of the lower lumbar spine.      No past medical history on file. Past Surgical History:  Procedure Laterality Date   APPENDECTOMY     age 41   FOOT SURGERY Bilateral    TONSILLECTOMY     age 24   Social History   Socioeconomic History    Marital status: Married    Spouse name: Not on file   Number of children: 2   Years of education: Not on file   Highest education level: Not on file  Occupational History   Occupation: Warehouse manager: [employer]  Tobacco Use   Smoking status: Never   Smokeless tobacco: Never  Vaping Use   Vaping Use: Never used  Substance and Sexual Activity   Alcohol use: No   Drug use: No   Sexual activity: Not on file  Other Topics Concern   Not on file  Social History Narrative   Not on file   Social Determinants of Health   Financial Resource Strain: Not on file  Food Insecurity: Not on file  Transportation Needs: Not on file  Physical Activity: Not on file  Stress: Not on file  Social Connections: Not on file   Allergies  Allergen Reactions   Codeine     "jittery"   Gabapentin Nausea Only, Anxiety and Other (See Comments)    Jittery/hallucinations    Family History  Problem Relation Age of Onset   Congestive Heart Failure Father    Other Mother        brain tumors-benign   Colon cancer Neg Hx        Current Outpatient Medications (Analgesics):    aspirin 81 MG tablet, Take 81 mg by mouth daily.   ibuprofen (ADVIL,MOTRIN) 200 MG tablet, Take 400 mg by mouth as needed for pain.  Current Outpatient Medications (Other):    Ascorbic Acid (VITAMIN C PO), Take 1 capsule by mouth daily.   B Complex Vitamins (VITAMIN-B COMPLEX) TABS, Take 1 tablet by mouth daily.   Docusate Calcium (STOOL SOFTENER PO), Take 1 capsule by mouth daily.   fish oil-omega-3 fatty acids 1000 MG capsule, Take 2 g by mouth daily.   gabapentin (NEURONTIN) 100 MG capsule, Take 2 capsules (200 mg total) by mouth at bedtime.   MULTIPLE VITAMIN PO, Take 1 tablet by mouth daily.   PARoxetine (PAXIL) 10 MG tablet, Take 10 mg by mouth daily.   Polyethylene Glycol POWD, 1 g by Does not apply route daily.   Reviewed prior external information including notes and imaging from  primary  care provider As well as notes that were available from care everywhere and other healthcare systems.  Past medical history, social, surgical and family history all reviewed in electronic medical record.  No pertanent information unless stated regarding to the chief complaint.   Review of Systems:  No headache, visual changes, nausea, vomiting, diarrhea, constipation, dizziness, abdominal pain, skin rash, fevers, chills, night sweats, weight loss, swollen lymph nodes, body aches, joint swelling, chest pain, shortness of breath, mood changes. POSITIVE muscle aches  Objective  Blood pressure 106/76, pulse 77, height 5\' 2"  (1.575 m), weight 126 lb (57.2 kg), SpO2 98 %.   General: No apparent distress alert and oriented x3 mood and affect normal, dressed appropriately.  HEENT: Pupils equal, extraocular movements intact  Respiratory: Patient's speak in full sentences and does not appear short of breath  Cardiovascular: No lower extremity edema, non tender, no erythema  Gait normal with good balance and coordination.  MSK: Low back exam does have some loss of lordosis.  Tightness noted in the paraspinal musculature.  Patient does still have some weakness of hip abductors.  Negative straight leg test though noted.  Pain over the coccyx area in the sacroiliac joints bilaterally.  Osteopathic findings show T5 extended rotated and side bent right L1 flexed rotated and side bent right Sacrum right on right Pelvic ilium anterior left    Impression and Recommendations:     The above documentation has been reviewed and is accurate and complete Kayla Pulley, DO

## 2021-03-26 ENCOUNTER — Ambulatory Visit: Payer: PPO | Admitting: Family Medicine

## 2021-03-26 ENCOUNTER — Other Ambulatory Visit: Payer: Self-pay

## 2021-03-26 VITALS — BP 106/76 | HR 77 | Ht 62.0 in | Wt 126.0 lb

## 2021-03-26 DIAGNOSIS — M9902 Segmental and somatic dysfunction of thoracic region: Secondary | ICD-10-CM

## 2021-03-26 DIAGNOSIS — M9908 Segmental and somatic dysfunction of rib cage: Secondary | ICD-10-CM | POA: Diagnosis not present

## 2021-03-26 DIAGNOSIS — M9904 Segmental and somatic dysfunction of sacral region: Secondary | ICD-10-CM | POA: Diagnosis not present

## 2021-03-26 DIAGNOSIS — M533 Sacrococcygeal disorders, not elsewhere classified: Secondary | ICD-10-CM | POA: Diagnosis not present

## 2021-03-26 DIAGNOSIS — M9903 Segmental and somatic dysfunction of lumbar region: Secondary | ICD-10-CM | POA: Diagnosis not present

## 2021-03-26 DIAGNOSIS — M9905 Segmental and somatic dysfunction of pelvic region: Secondary | ICD-10-CM | POA: Diagnosis not present

## 2021-03-26 NOTE — Assessment & Plan Note (Signed)
   Decision today to treat with OMT was based on Physical Exam  After verbal consent patient was treated with HVLA, ME, FPR techniques in pelvis, thoracic, rib, lumbar and sacral areas, all areas are chronic   Patient tolerated the procedure well with improvement in symptoms  Patient given exercises, stretches and lifestyle modifications  See medications in patient instructions if given  Patient will follow up in 4-8 weeks 

## 2021-03-26 NOTE — Patient Instructions (Signed)
Keep working with PT Adductor strength See me in 6 weeks

## 2021-03-26 NOTE — Assessment & Plan Note (Signed)
Patient is making some improvement noted with the pelvic floor physical therapy.  Patient did respond fairly well to osteopathic manipulation encourage patient to continue to do so.  Discussed icing regimen and home exercises.  Discussed which activities to do which wants to avoid.  Follow-up with me again in 6 weeks

## 2021-03-31 ENCOUNTER — Ambulatory Visit: Payer: PPO | Attending: Family Medicine | Admitting: Physical Therapy

## 2021-03-31 ENCOUNTER — Other Ambulatory Visit: Payer: Self-pay

## 2021-03-31 DIAGNOSIS — R252 Cramp and spasm: Secondary | ICD-10-CM | POA: Diagnosis not present

## 2021-03-31 DIAGNOSIS — R2689 Other abnormalities of gait and mobility: Secondary | ICD-10-CM | POA: Insufficient documentation

## 2021-03-31 DIAGNOSIS — M6281 Muscle weakness (generalized): Secondary | ICD-10-CM | POA: Diagnosis not present

## 2021-03-31 DIAGNOSIS — R279 Unspecified lack of coordination: Secondary | ICD-10-CM | POA: Diagnosis not present

## 2021-03-31 NOTE — Therapy (Signed)
[facility] @ [place], [place], [address] Phone: [phone]   Fax:  [phone]  Physical Therapy Treatment  Patient Details  Name: Kayla [name] MRN: [id number] Date of Birth: 11/15/1950 Referring Provider (PT): [name], DO   Encounter Date: 03/31/2021   PT End of Session - 03/31/21 1528     Visit Number 4    Date for PT Re-Evaluation 06/20/21    Authorization Type health team    PT Start Time 0881    PT Stop Time 1528    PT Time Calculation (min) 42 min    Activity Tolerance Patient tolerated treatment well;No increased pain    Behavior During Therapy [facility] for tasks assessed/performed             No past medical history on file.  Past Surgical History:  Procedure Laterality Date   APPENDECTOMY     age 70   FOOT SURGERY Bilateral    TONSILLECTOMY     age 70    There were no vitals filed for this visit.   Subjective Assessment - 03/31/21 1449     Subjective Pt reports she has had a flare up with plantar fasciitis at Rt side s/p having her Rt kneecap dislocate and relcated and has had pain here since then. Pt reports she does feel like her coccyx pain has improved since PT now able to sit for up to 2.5 hours with ice and cushion however this is a great improvement as before she was unable to tolerate sitting longer than 30 mins.                            Pelvic Floor Special Questions - 03/31/21 0001     Pelvic Floor Internal Exam patient identified and patient confirms consent for PT to perform internal soft tissue work and muscle strength and integrity assessment    Exam Type Rectal    Sensation WFL    Palpation TTP along Lt side of coccyx internally: TTP at Lt Puborectalis, pubococcygeus, Iliococcygeus. see manual for details for treatment.               [facility] Adult PT Treatment/Exercise - 03/31/21 0001       Lumbar Exercises: Stretches    Other Lumbar Stretch Exercise hip hinge stretch 2x30s with toes inward      Manual Therapy   Manual Therapy Myofascial release    Manual therapy comments manual work completed with internal pelvic floor  mobilization treatment internally at rectum for coccyx mobility and decreased pain. Tightness and tenderness with palpation noted at Lt Puborectalis,pubococcygeus, Iliococcygeus and manual gentle stretch provided within pt's tolerance and consent for all treatment. Pt reported greatly decreased pain at end of session. Pt tolerated well and continued to have improved pain relief. Coccyx manual mobility aslo completed for extension. Pt also lead in manual work with sacral springing in prone and trigger point release in prone along Lt side of coccyx and twn trigger point found and released in [place].                     PT Education - 03/31/21 1528     Education Details Pt educated on continued HEP, to not do any with strain to Rt knee with recent injury here and continued voiding mechanics.    Person(s) Educated Patient    Methods Explanation;Demonstration;Tactile cues;Verbal cues  Comprehension Verbalized understanding;Returned demonstration              PT Short Term Goals - 02/18/21 1708       PT SHORT TERM GOAL #1   Title Pt to be I with HEP    Time 5    Period Weeks    Status New    Target Date 03/25/21      PT SHORT TERM GOAL #2   Title pt to report no more than6/10 pain at coccyx with sitting to increase tolerance to 30 mins without ice pack    Time 5    Period Weeks    Status New    Target Date 03/25/21      PT SHORT TERM GOAL #3   Title pt to demonstrate improved bil hip pain to at least 4/5 globally for improved functional mobility and decreased compensation    Time 5    Period Weeks    Status New    Target Date 03/25/21      PT SHORT TERM GOAL #4   Title pt to tolerate vaginal dilator size 4 or equivalent for improved tolerance to vaginal  penetration    Time 5    Period Weeks    Status New    Target Date 03/25/21               PT Long Term Goals - 02/18/21 1711       PT LONG TERM GOAL #1   Baseline pt to be I with advanced HEP    Time 4    Period Months    Status New    Target Date 06/20/21      PT LONG TERM GOAL #2   Title pt to tolerate vaginal dilator size 6 or equivalent for improved tolerance to vaginal penetration    Time 4    Period Months    Status New    Target Date 06/20/21      PT LONG TERM GOAL #3   Title pt to be I with self correction of proper breathing mechanics and core/pelvic floor contract/relaxation with mobility to decrease compensatory strategies    Time 4    Period Months    Status New    Target Date 06/20/21      PT LONG TERM GOAL #4   Title pt to demonstrate improved bil hip pain to at least 455 globally for improved functional mobility and decreased compensation    Time 4    Period Months    Status New    Target Date 06/20/21      PT LONG TERM GOAL #5   Title pt to report no more than 2/10 pain at coccyx with sitting to increase tolerance to 30 mins without ice pack    Time 4    Period Months    Status New    Target Date 06/20/21                   Plan - 03/31/21 1529     Clinical Impression Statement Pt presents to clinc reporting continued improved pain with ability to now tolerate sitting for up to 2.5 hours though does use ice pack and cushion however does not need to replace ice pack as often. Pt reports she was able to tolerate longer in the car as well this past week. Pt session focused on interanl rectal trigger point release, gentle stretching mostly at Lt side and along coccyx, improved greatly since start  of PT and pt reported she felt great relief at end of session and tolerated all mobility better. Pt does continue to demonstrate tightness in Lt puborectalis, pubococcygeus, and iliococcygeus but improving. Pt also benefited from continued stretching  and manual work at sacrum and coccyx externally for deceased pain with good effect as well. Pt would benefit from continued PT for improvement with deficits found on eval and pain.    Personal Factors and Comorbidities Time since onset of injury/illness/exacerbation;Comorbidity 2;Fitness    Comorbidities x2 vaginal births, tearing with one, and history of injury to coccyx with falling on it ~2 years ago    Examination-Activity Limitations Sit;Sleep    Examination-Participation Restrictions Interpersonal Relationship;Yard Work;Community Activity;Driving;Occupation    Stability/Clinical Decision Making Evolving/Moderate complexity    Clinical Decision Making Moderate    Rehab Potential Good    PT Frequency 2x / week    PT Duration Other (comment)   10 weeks   PT Treatment/Interventions ADLs/Self Care Home Management;Aquatic Therapy;Functional mobility training;Therapeutic exercise;Traction;Neuromuscular re-education;Manual techniques;Patient/family education;Scar mobilization;Passive range of motion;Dry needling;Energy conservation;Visual/perceptual remediation/compensation;Vasopneumatic Device;Taping;Spinal Manipulations;Joint Manipulations    PT Next Visit Plan coccyx mobility, stretching    PT Home Exercise Plan Access Code: [email or web address]    Consulted and Agree with Plan of Care Patient             Patient will benefit from skilled therapeutic intervention in order to improve the following deficits and impairments:  Decreased coordination, Increased fascial restricitons, Decreased endurance, Pain, Postural dysfunction, Improper body mechanics, Impaired flexibility, Decreased mobility, Decreased strength  Visit Diagnosis: Cramp and spasm  Other abnormalities of gait and mobility  Lack of coordination     Problem List Patient Active Problem List   Diagnosis Date Noted   Somatic dysfunction of spine, sacral 02/12/2021   Right leg swelling 05/02/2019   Coccyx pain 01/10/2019    Onychomycosis 01/10/2019   Palpitations 02/03/2017   Factor V Leiden ([place]) 02/03/2017   Left bundle branch block (LBBB) 02/03/2017   Vaginal atrophy 12/03/2016   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 Patient is motivated to learn. Patient understands and agrees with treatment goals and plan. PT explains patient will be examined in standing, sitting, and lying down to see how their muscles and joints work. When they are ready, they will be asked to remove their underwear so PT can examine their perineum. The patient is also given the option of providing their own chaperone as one is not provided in our facility. The patient also has the right and is explained the right to defer or refuse any part of the evaluation or treatment including the internal exam. With the patient's consent, PT will use one gloved finger to gently assess the muscles of the pelvic floor, seeing how well it contracts and relaxes and if there is muscle symmetry. After, the patient will get dressed and PT and patient will discuss exam findings and plan of care. PT and patient discuss plan of care, schedule, attendance policy and HEP activities.   Stacy Gardner, PT, DPT 10/10/223:33 PM   [facility] @ [place], [place], [address] Phone: [phone]   Fax:  [phone]  Name: Rozlyn Yerby Castagnola MRN: [id number] Date of Birth: 1951-01-03

## 2021-04-02 ENCOUNTER — Other Ambulatory Visit: Payer: Self-pay

## 2021-04-02 ENCOUNTER — Ambulatory Visit: Payer: PPO | Admitting: Physical Therapy

## 2021-04-02 ENCOUNTER — Encounter: Payer: Self-pay | Admitting: Physical Therapy

## 2021-04-02 DIAGNOSIS — M6281 Muscle weakness (generalized): Secondary | ICD-10-CM

## 2021-04-02 DIAGNOSIS — R252 Cramp and spasm: Secondary | ICD-10-CM | POA: Diagnosis not present

## 2021-04-02 DIAGNOSIS — R2689 Other abnormalities of gait and mobility: Secondary | ICD-10-CM

## 2021-04-02 NOTE — Therapy (Signed)
[facility] @ [place], [place], [address] Phone: [phone]   Fax:  [phone]  Physical Therapy Treatment  Patient Details  Name: Kayla [name] MRN: [id number] Date of Birth: 09-Jan-1951 Referring Provider (PT): [name], DO   Encounter Date: 04/02/2021   PT End of Session - 04/02/21 1615     Visit Number 5    Date for PT Re-Evaluation 06/20/21    Authorization Type health team    PT Start Time 6962    PT Stop Time 1610    PT Time Calculation (min) 40 min    Activity Tolerance Patient tolerated treatment well;No increased pain    Behavior During Therapy [facility] for tasks assessed/performed             History reviewed. No pertinent past medical history.  Past Surgical History:  Procedure Laterality Date   APPENDECTOMY     age 70   FOOT SURGERY Bilateral    TONSILLECTOMY     age 70    There were no vitals filed for this visit.   Subjective Assessment - 04/02/21 1536     Subjective Pt reports continued plantar fasciitis in Rt foot which has limited HEP and working out, Rt knee is starting to feel better. Pt reports same times for sitting and use of ice/cushion as last session. Uses cushion and ice pack however reports this is more preventively at this point as she doesn't want to have pain.    How long can you sit comfortably? 30 mins with ice pack over pillow/memory foam at coccyx    How long can you stand comfortably? no limits    How long can you walk comfortably? no limits    Currently in Pain? Yes    Pain Score 5     Pain Location Coccyx    Pain Orientation Left    Pain Descriptors / Indicators Burning    Pain Type Chronic pain                               [facility] Adult PT Treatment/Exercise - 04/02/21 0001       Manual Therapy   Manual Therapy Soft tissue mobilization;Myofascial release;Internal Pelvic Floor    Manual therapy comments abdominal  massage in supine completed as pt reported some increased pelvic discomfort with new constipation and feels she has been straining to have BM and having pain intermittently at coccyx.    Internal Pelvic Floor rectal treatment provided at pt's request to continue to further improve pain management at coccyx, mild tightness noted at Lt puborectalis, pubococcygeus, iliococcygeus but improved since starting PT. Manual coccyx mobilization into extension completed as well with noted restrictions however gentle overpressure for stretching improved pt's pain overall.                     PT Education - 04/02/21 1615     Education Details Pt educated on stretching, ice for knee as needed but this improving per pt and how to do abdominal massage at home    Person(s) Educated Patient    Methods Explanation;Tactile cues;Demonstration;Verbal cues    Comprehension Verbalized understanding;Returned demonstration              PT Short Term Goals - 02/18/21 1708       PT SHORT TERM GOAL #1   Title Pt to be I with HEP  Time 5    Period Weeks    Status New    Target Date 03/25/21      PT SHORT TERM GOAL #2   Title pt to report no more than6/10 pain at coccyx with sitting to increase tolerance to 30 mins without ice pack    Time 5    Period Weeks    Status New    Target Date 03/25/21      PT SHORT TERM GOAL #3   Title pt to demonstrate improved bil hip pain to at least 4/5 globally for improved functional mobility and decreased compensation    Time 5    Period Weeks    Status New    Target Date 03/25/21      PT SHORT TERM GOAL #4   Title pt to tolerate vaginal dilator size 4 or equivalent for improved tolerance to vaginal penetration    Time 5    Period Weeks    Status New    Target Date 03/25/21               PT Long Term Goals - 02/18/21 1711       PT LONG TERM GOAL #1   Baseline pt to be I with advanced HEP    Time 4    Period Months    Status New    Target  Date 06/20/21      PT LONG TERM GOAL #2   Title pt to tolerate vaginal dilator size 6 or equivalent for improved tolerance to vaginal penetration    Time 4    Period Months    Status New    Target Date 06/20/21      PT LONG TERM GOAL #3   Title pt to be I with self correction of proper breathing mechanics and core/pelvic floor contract/relaxation with mobility to decrease compensatory strategies    Time 4    Period Months    Status New    Target Date 06/20/21      PT LONG TERM GOAL #4   Title pt to demonstrate improved bil hip pain to at least 455 globally for improved functional mobility and decreased compensation    Time 4    Period Months    Status New    Target Date 06/20/21      PT LONG TERM GOAL #5   Title pt to report no more than 2/10 pain at coccyx with sitting to increase tolerance to 30 mins without ice pack    Time 4    Period Months    Status New    Target Date 06/20/21                   Plan - 04/02/21 1717     Clinical Impression Statement Pt presents to clinic reporting improved pain now able to attempt short times without ice or cushion on soft surfaces, educated to attempt as able without ice and/or cushion and agreed. Pt requested internal treatment this session to continue to improve pain, this was completed with pt reporting improved symptoms post session with gentle stretching at Lt side of rectum and manual gentle mobs at coccyx for extension. Pt tolerated well. Pt reported recent constipation and lead in abdominal massage and how to do this at home with CW/CWW method completed to decrease strain for BMs and increased pain at coccyx. Pt would benefit from continued PT for improvement with deficits found on eval and pain.    Personal Factors  and Comorbidities Time since onset of injury/illness/exacerbation;Comorbidity 2;Fitness    Comorbidities x2 vaginal births, tearing with one, and history of injury to coccyx with falling on it ~2 years ago     Examination-Activity Limitations Sit;Sleep    Examination-Participation Restrictions Interpersonal Relationship;Yard Work;Community Activity;Driving;Occupation    Stability/Clinical Decision Making Evolving/Moderate complexity    Rehab Potential Good    PT Frequency 2x / week    PT Duration Other (comment)   10 weeks   PT Treatment/Interventions ADLs/Self Care Home Management;Aquatic Therapy;Functional mobility training;Therapeutic exercise;Traction;Neuromuscular re-education;Manual techniques;Patient/family education;Scar mobilization;Passive range of motion;Dry needling;Energy conservation;Visual/perceptual remediation/compensation;Vasopneumatic Device;Taping;Spinal Manipulations;Joint Manipulations    PT Next Visit Plan coccyx mobility, stretching    PT Home Exercise Plan Access Code: [email or web address]    Consulted and Agree with Plan of Care Patient             Patient will benefit from skilled therapeutic intervention in order to improve the following deficits and impairments:  Decreased coordination, Increased fascial restricitons, Decreased endurance, Pain, Postural dysfunction, Improper body mechanics, Impaired flexibility, Decreased mobility, Decreased strength  Visit Diagnosis: Cramp and spasm  Muscle weakness (generalized)  Other abnormalities of gait and mobility     Problem List Patient Active Problem List   Diagnosis Date Noted   Somatic dysfunction of spine, sacral 02/12/2021   Right leg swelling 05/02/2019   Coccyx pain 01/10/2019   Onychomycosis 01/10/2019   Palpitations 02/03/2017   Factor V Leiden ([place]) 02/03/2017   Left bundle branch block (LBBB) 02/03/2017   Vaginal atrophy 12/03/2016   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 Patient is motivated to learn. Patient understands and agrees with treatment goals and plan.  PT explains patient will be examined in standing, sitting, and lying down to see how their muscles and joints work. When they are ready, they will be asked to remove their underwear so PT can examine their perineum. The patient is also given the option of providing their own chaperone as one is not provided in our facility. The patient also has the right and is explained the right to defer or refuse any part of the evaluation or treatment including the internal exam. With the patient's consent, PT will use one gloved finger to gently assess the muscles of the pelvic floor, seeing how well it contracts and relaxes and if there is muscle symmetry. After, the patient will get dressed and PT and patient will discuss exam findings and plan of care. PT and patient discuss plan of care, schedule, attendance policy and HEP activities.  Stacy Gardner, PT, DPT 10/12/225:20 PM    [facility] @ [place], [place], [address] Phone: [phone]   Fax:  [phone]  Name: Mandeep Ferch Campoy MRN: [id number] Date of Birth: 06/26/1950

## 2021-04-09 ENCOUNTER — Other Ambulatory Visit: Payer: Self-pay

## 2021-04-09 ENCOUNTER — Ambulatory Visit: Payer: PPO | Admitting: Physical Therapy

## 2021-04-09 DIAGNOSIS — R279 Unspecified lack of coordination: Secondary | ICD-10-CM

## 2021-04-09 DIAGNOSIS — R252 Cramp and spasm: Secondary | ICD-10-CM | POA: Diagnosis not present

## 2021-04-09 NOTE — Therapy (Signed)
[facility] @ [place], [place], [address] Phone: [phone]   Fax:  [phone]  Physical Therapy Treatment  Patient Details  Name: Kayla [name] MRN: [id number] Date of Birth: 19-Oct-1950 Referring Provider (PT): [name], DO   Encounter Date: 04/09/2021   PT End of Session - 04/09/21 1442     Visit Number 6    Date for PT Re-Evaluation 06/20/21    Authorization Type health team    PT Start Time 1401    PT Stop Time 1441    PT Time Calculation (min) 40 min    Activity Tolerance Patient tolerated treatment well;No increased pain    Behavior During Therapy [facility] for tasks assessed/performed             No past medical history on file.  Past Surgical History:  Procedure Laterality Date   APPENDECTOMY     age 38   FOOT SURGERY Bilateral    TONSILLECTOMY     age 35    There were no vitals filed for this visit.   Subjective Assessment - 04/09/21 1406     Subjective Pt reports sunday she sat for several hours had increased pain at coccyx and was very sore however with time and ice this decreased and improved. Over the past week in general pt reports ability to only use ice after a few hours of sitting (during a movie) and mostly just cushion now when sitting.    How long can you sit comfortably? with cushion ~1 hours no ice pack    How long can you stand comfortably? no limits    How long can you walk comfortably? no limits    Patient Stated Goals to have less pain    Currently in Pain? Yes    Pain Score 5     Pain Location Coccyx    Pain Orientation Left    Pain Descriptors / Indicators Burning    Pain Type Chronic pain                               [facility] Adult PT Treatment/Exercise - 04/09/21 0001       Self-Care   Self-Care Other Self-Care Comments    Other Self-Care Comments  pt educated and completed x1 of sidelying hip flexion and abduction on Lt  leg to improve stretches at coccyx as pt unable to tolerate quad or happy baby fully due to pain at Rt knee.      Manual Therapy   Manual Therapy Internal Pelvic Floor    Internal Pelvic Floor rectal treatment provided at pt's request to continue to further improve pain management at coccyx, tightness noted at Lt puborectalis, pubococcygeus, iliococcygeus but improved since starting PT though mild increase in tightness since last visit likely related to greatly increased time in sitting on Sunday and increased pain until tuesday. Manual coccyx mobilization into extension completed as well with noted restrictions however gentle overpressure for stretching improved pt's pain overall.                     PT Education - 04/09/21 1441     Education Details Pt educated on stretching continued and additions to this for home to improve mobility and decreased tightness at coccyx    Person(s) Educated Patient    Methods Explanation;Demonstration;Tactile cues;Verbal cues    Comprehension Returned demonstration;Verbalized understanding  PT Short Term Goals - 02/18/21 1708       PT SHORT TERM GOAL #1   Title Pt to be I with HEP    Time 5    Period Weeks    Status New    Target Date 03/25/21      PT SHORT TERM GOAL #2   Title pt to report no more than6/10 pain at coccyx with sitting to increase tolerance to 30 mins without ice pack    Time 5    Period Weeks    Status New    Target Date 03/25/21      PT SHORT TERM GOAL #3   Title pt to demonstrate improved bil hip pain to at least 4/5 globally for improved functional mobility and decreased compensation    Time 5    Period Weeks    Status New    Target Date 03/25/21      PT SHORT TERM GOAL #4   Title pt to tolerate vaginal dilator size 4 or equivalent for improved tolerance to vaginal penetration    Time 5    Period Weeks    Status New    Target Date 03/25/21               PT Long Term Goals -  02/18/21 1711       PT LONG TERM GOAL #1   Baseline pt to be I with advanced HEP    Time 4    Period Months    Status New    Target Date 06/20/21      PT LONG TERM GOAL #2   Title pt to tolerate vaginal dilator size 6 or equivalent for improved tolerance to vaginal penetration    Time 4    Period Months    Status New    Target Date 06/20/21      PT LONG TERM GOAL #3   Title pt to be I with self correction of proper breathing mechanics and core/pelvic floor contract/relaxation with mobility to decrease compensatory strategies    Time 4    Period Months    Status New    Target Date 06/20/21      PT LONG TERM GOAL #4   Title pt to demonstrate improved bil hip pain to at least 455 globally for improved functional mobility and decreased compensation    Time 4    Period Months    Status New    Target Date 06/20/21      PT LONG TERM GOAL #5   Title pt to report no more than 2/10 pain at coccyx with sitting to increase tolerance to 30 mins without ice pack    Time 4    Period Months    Status New    Target Date 06/20/21                   Plan - 04/09/21 1442     Clinical Impression Statement Pt presents to clinic with improvement of tolerance to sitting without ice pack since last visit and able to now sit for 1 hour without pain. Pt had to sit in car fo several hours on sunday with traveling and had increased pain sunday and monday decreased on tueday and feels a little tighter now per pt. Pt reports post last session with abdominal massage pt had BM post session and felt much better. Session today focused on internal rectal gentle stretching at Lt side of rectum with mildly increased tightness  compared to last session and pt reported this felt much better at end of session. pt also educated on stretching in sidlying to improve tolerance to stretching compared to quad as pt unable to do this with current Rt knee pain.  Pt would benefit from continued PT for improvement  with deficits found on eval and pain.    Personal Factors and Comorbidities Time since onset of injury/illness/exacerbation;Comorbidity 2;Fitness    Comorbidities x2 vaginal births, tearing with one, and history of injury to coccyx with falling on it ~2 years ago    Examination-Activity Limitations Sit;Sleep    Examination-Participation Restrictions Interpersonal Relationship;Yard Work;Community Activity;Driving;Occupation    Stability/Clinical Decision Making Evolving/Moderate complexity    Rehab Potential Good    PT Frequency 2x / week    PT Duration Other (comment)   10 weeks   PT Treatment/Interventions ADLs/Self Care Home Management;Aquatic Therapy;Functional mobility training;Therapeutic exercise;Traction;Neuromuscular re-education;Manual techniques;Patient/family education;Scar mobilization;Passive range of motion;Dry needling;Energy conservation;Visual/perceptual remediation/compensation;Vasopneumatic Device;Taping;Spinal Manipulations;Joint Manipulations    PT Next Visit Plan coccyx mobility, stretching    PT Home Exercise Plan Access Code: [email or web address]    Consulted and Agree with Plan of Care Patient             Patient will benefit from skilled therapeutic intervention in order to improve the following deficits and impairments:  Decreased coordination, Increased fascial restricitons, Decreased endurance, Pain, Postural dysfunction, Improper body mechanics, Impaired flexibility, Decreased mobility, Decreased strength  Visit Diagnosis: Cramp and spasm  Lack of coordination     Problem List Patient Active Problem List   Diagnosis Date Noted   Somatic dysfunction of spine, sacral 02/12/2021   Right leg swelling 05/02/2019   Coccyx pain 01/10/2019   Onychomycosis 01/10/2019   Palpitations 02/03/2017   Factor V Leiden ([place]) 02/03/2017   Left bundle branch block (LBBB) 02/03/2017   Vaginal atrophy 12/03/2016   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 Patient is motivated to learn. Patient understands and agrees with treatment goals and plan. PT explains patient will be examined in standing, sitting, and lying down to see how their muscles and joints work. When they are ready, they will be asked to remove their underwear so PT can examine their perineum. The patient is also given the option of providing their own chaperone as one is not provided in our facility. The patient also has the right and is explained the right to defer or refuse any part of the evaluation or treatment including the internal exam. With the patient's consent, PT will use one gloved finger to gently assess the muscles of the pelvic floor, seeing how well it contracts and relaxes and if there is muscle symmetry. After, the patient will get dressed and PT and patient will discuss exam findings and plan of care. PT and patient discuss plan of care, schedule, attendance policy and HEP activities.   Stacy Gardner, PT, DPT 10/19/222:45 PM   [facility] @ [place], [place], [address] Phone: [phone]   Fax:  [phone]  Name: Lacresha Fusilier Prosch MRN: [id number] Date of Birth: 07/26/1950

## 2021-04-14 ENCOUNTER — Other Ambulatory Visit: Payer: Self-pay

## 2021-04-14 ENCOUNTER — Ambulatory Visit: Payer: PPO | Admitting: Physical Therapy

## 2021-04-14 DIAGNOSIS — R252 Cramp and spasm: Secondary | ICD-10-CM

## 2021-04-14 DIAGNOSIS — M6281 Muscle weakness (generalized): Secondary | ICD-10-CM

## 2021-04-14 DIAGNOSIS — R279 Unspecified lack of coordination: Secondary | ICD-10-CM

## 2021-04-14 NOTE — Therapy (Signed)
[facility] @ [place], [place], [address] Phone: [phone]   Fax:  [phone]  Physical Therapy Treatment  Patient Details  Name: Kayla [name] MRN: [id number] Date of Birth: 06-07-1951 Referring Provider (PT): [name], DO   Encounter Date: 04/14/2021   PT End of Session - 04/14/21 1611     Visit Number 7    Date for PT Re-Evaluation 06/20/21    Authorization Type health team    PT Start Time 8546    PT Stop Time 1612    PT Time Calculation (min) 40 min    Activity Tolerance Patient tolerated treatment well;No increased pain    Behavior During Therapy [facility] for tasks assessed/performed             No past medical history on file.  Past Surgical History:  Procedure Laterality Date   APPENDECTOMY     age 70   FOOT SURGERY Bilateral    TONSILLECTOMY     age 70    There were no vitals filed for this visit.   Subjective Assessment - 04/14/21 1534     Subjective pt reports pain at coccyx has continued to be improving, though tried to stretch with three finger release at coccyx though made her more sore. Pt reports greatly less need of ice since last visit, cushion less as well. Did need ice pack at end of meal out ~1.5 on hard surface.    How long can you sit comfortably? with cushion ~1.5 hours no ice pack    How long can you stand comfortably? no limits    How long can you walk comfortably? no limits    Patient Stated Goals to have less pain    Currently in Pain? Yes    Pain Score 6    with sitting more today without cushion or ice pack   Pain Location Coccyx    Pain Orientation Left    Pain Descriptors / Indicators Burning    Pain Type Chronic pain                            Pelvic Floor Special Questions - 04/14/21 0001     Pelvic Floor Internal Exam patient identified and patient confirms consent for PT to perform internal soft tissue work and  muscle strength and integrity assessment    Exam Type Rectal    Sensation WFL    Palpation TTP along Lt side of coccyx internally: TTP at Lt and Rt Puborectalis, pubococcygeus, Iliococcygeus. see manual for details for treatment.               [facility] Adult PT Treatment/Exercise - 04/14/21 0001       Manual Therapy   Manual Therapy Internal Pelvic Floor    Internal Pelvic Floor rectal treatment provided at pt's request to continue to further improve pain management at coccyx, tightness noted at Lt puborectalis, pubococcygeus, iliococcygeus but improved since starting PT. Pt reported mild TTP at Rt side of puborectalis, pubococcygeus this session but left still greater. Manual coccyx mobilization into extension completed as well with noted restrictions however gentle overpressure for stretching improved pt's pain overall. Pt denied tightness or pain at end of this treatment.                     PT Education - 04/14/21 1610     Education Details Pt educated  on stretching modification as needed due to pain at knees in quad, pt verbalized understanding and demonstration given.    Person(s) Educated Patient    Methods Explanation;Demonstration;Tactile cues;Verbal cues    Comprehension Verbalized understanding;Returned demonstration              PT Short Term Goals - 02/18/21 1708       PT SHORT TERM GOAL #1   Title Pt to be I with HEP    Time 5    Period Weeks    Status New    Target Date 03/25/21      PT SHORT TERM GOAL #2   Title pt to report no more than6/10 pain at coccyx with sitting to increase tolerance to 30 mins without ice pack    Time 5    Period Weeks    Status New    Target Date 03/25/21      PT SHORT TERM GOAL #3   Title pt to demonstrate improved bil hip pain to at least 4/5 globally for improved functional mobility and decreased compensation    Time 5    Period Weeks    Status New    Target Date 03/25/21      PT SHORT TERM GOAL #4   Title  pt to tolerate vaginal dilator size 4 or equivalent for improved tolerance to vaginal penetration    Time 5    Period Weeks    Status New    Target Date 03/25/21               PT Long Term Goals - 02/18/21 1711       PT LONG TERM GOAL #1   Baseline pt to be I with advanced HEP    Time 4    Period Months    Status New    Target Date 06/20/21      PT LONG TERM GOAL #2   Title pt to tolerate vaginal dilator size 6 or equivalent for improved tolerance to vaginal penetration    Time 4    Period Months    Status New    Target Date 06/20/21      PT LONG TERM GOAL #3   Title pt to be I with self correction of proper breathing mechanics and core/pelvic floor contract/relaxation with mobility to decrease compensatory strategies    Time 4    Period Months    Status New    Target Date 06/20/21      PT LONG TERM GOAL #4   Title pt to demonstrate improved bil hip pain to at least 455 globally for improved functional mobility and decreased compensation    Time 4    Period Months    Status New    Target Date 06/20/21      PT LONG TERM GOAL #5   Title pt to report no more than 2/10 pain at coccyx with sitting to increase tolerance to 30 mins without ice pack    Time 4    Period Months    Status New    Target Date 06/20/21                   Plan - 04/14/21 1612     Clinical Impression Statement Pt presnts to clinic with continued improvement in coccyx pain, now able to sit without ice pack and just cushion for up to 1.5 hours and can sit briefly without cushion as well even on harder surfaces. Pt reports if  longer than 1.5 hours with will need to use ice pack to decrease pain. Pt session focused on internal work rectally and education on positions to self stretch rectally at home and trigger point release at home, and stretching at hips for decreased tightness at coccyx to decrease tightness. Pt tolerated well and reported improvment at end of session to less pain than  start of session and denied all TTP at end of internal treatment. Pt would benefit from continued PT for improvement with deficits found on eval and pain.    Personal Factors and Comorbidities Time since onset of injury/illness/exacerbation;Comorbidity 2;Fitness    Comorbidities x2 vaginal births, tearing with one, and history of injury to coccyx with falling on it ~2 years ago    Examination-Activity Limitations Sit;Sleep    Examination-Participation Restrictions Interpersonal Relationship;Yard Work;Community Activity;Driving;Occupation    Stability/Clinical Decision Making Evolving/Moderate complexity    Clinical Decision Making Moderate    Rehab Potential Good    PT Frequency 2x / week    PT Duration Other (comment)   10 weeks   PT Treatment/Interventions ADLs/Self Care Home Management;Aquatic Therapy;Functional mobility training;Therapeutic exercise;Traction;Neuromuscular re-education;Manual techniques;Patient/family education;Scar mobilization;Passive range of motion;Dry needling;Energy conservation;Visual/perceptual remediation/compensation;Vasopneumatic Device;Taping;Spinal Manipulations;Joint Manipulations    PT Next Visit Plan coccyx mobility, stretching    PT Home Exercise Plan Access Code: [email or web address]    Consulted and Agree with Plan of Care Patient             Patient will benefit from skilled therapeutic intervention in order to improve the following deficits and impairments:  Decreased coordination, Increased fascial restricitons, Decreased endurance, Pain, Postural dysfunction, Improper body mechanics, Impaired flexibility, Decreased mobility, Decreased strength  Visit Diagnosis: Cramp and spasm  Lack of coordination  Muscle weakness (generalized)     Problem List Patient Active Problem List   Diagnosis Date Noted   Somatic dysfunction of spine, sacral 02/12/2021   Right leg swelling 05/02/2019   Coccyx pain 01/10/2019   Onychomycosis 01/10/2019   Palpitations  02/03/2017   Factor V Leiden ([place]) 02/03/2017   Left bundle branch block (LBBB) 02/03/2017   Vaginal atrophy 12/03/2016   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

## 2021-04-16 ENCOUNTER — Other Ambulatory Visit: Payer: Self-pay

## 2021-04-16 ENCOUNTER — Ambulatory Visit: Payer: PPO | Admitting: Physical Therapy

## 2021-04-16 DIAGNOSIS — R252 Cramp and spasm: Secondary | ICD-10-CM

## 2021-04-16 DIAGNOSIS — M6281 Muscle weakness (generalized): Secondary | ICD-10-CM

## 2021-04-16 NOTE — Therapy (Signed)
[facility] [place], [place], [address] Phone: [phone]   Fax:  [phone]  Physical Therapy Treatment  Patient Details  Name: Kayla [name] MRN: [id number] Date of Birth: 11-Jun-1951 Referring Provider (PT): [name], DO   Encounter Date: 04/16/2021   PT End of Session - 04/16/21 1633     Visit Number 8    Date for PT Re-Evaluation 06/20/21    Authorization Type health team    PT Start Time 2355    PT Stop Time 7322    PT Time Calculation (min) 44 min    Activity Tolerance Patient tolerated treatment well;No increased pain    Behavior During Therapy [facility] for tasks assessed/performed             No past medical history on file.  Past Surgical History:  Procedure Laterality Date   APPENDECTOMY     age 70   FOOT SURGERY Bilateral    TONSILLECTOMY     age 70    There were no vitals filed for this visit.   Subjective Assessment - 04/16/21 1543     Subjective Pt reports she attempted internal stretching at home but had increased pain afterward and didn't think she completed this correctly. Pt also reported modified standing technique with stretches instead of quad was very helpful and no pain at knees for this.    How long can you sit comfortably? with cushion ~1.5 hours no ice pack    How long can you stand comfortably? no limits    How long can you walk comfortably? no limits    Patient Stated Goals to have less pain    Currently in Pain? Yes    Pain Score 4     Pain Location Coccyx    Pain Orientation Left    Pain Descriptors / Indicators Burning    Pain Type Chronic pain    Pain Frequency Constant   constant with sitting; intermittent with sidelying   Aggravating Factors  sitting, now able to lay on Lt side                     No emotional/communication barriers or cognitive limitation. Patient is motivated to learn. Patient understands and agrees with  treatment goals and plan. PT explains patient will be examined in standing, sitting, and lying down to see how their muscles and joints work. When they are ready, they will be asked to remove their underwear so PT can examine their perineum. The patient is also given the option of providing their own chaperone as one is not provided in our facility. The patient also has the right and is explained the right to defer or refuse any part of the evaluation or treatment including the internal exam. With the patient's consent, PT will use one gloved finger to gently assess the muscles of the pelvic floor, seeing how well it contracts and relaxes and if there is muscle symmetry. After, the patient will get dressed and PT and patient will discuss exam findings and plan of care. PT and patient discuss plan of care, schedule, attendance policy and HEP activities.        Pelvic Floor Special Questions - 04/16/21 0001     Pelvic Floor Internal Exam patient identified and patient confirms consent for PT to perform internal soft tissue work and muscle strength and integrity assessment    Exam Type Rectal    Sensation [facility]  Palpation TTP at [facility], pubococcygeus, denied TTP at coccyx.               [facility] Adult PT Treatment/Exercise - 04/16/21 0001       Self-Care   Self-Care Other Self-Care Comments    Other Self-Care Comments  Pt educated on external stretching at glutes, and levator ani to decrease strain at coccyx and pt had many questions about internal stretching to further decrease pain at home, these all answered however pt educated that there was not an increased about of tightness noted internally this session and to attempt external stretching first between now and next session to see if this helps as she reported having increase difficulty attempting this previously.      Manual Therapy   Manual Therapy Internal Pelvic Floor;Soft tissue mobilization    Soft tissue mobilization  addaday used at bil gluteals to decrease tension and strain at coccyx. PT lead pt in gentle external stretching with pt in prone position at gluteals and at coccyx to improve mobility, pt had slight discomfort at Lt side of coccyx initially but improved quickly and denied additional TTP. However in this position, towel used for drapping, PT found very slight opening of skin along glueal crease and slightly decreased anal wrinkling at posterior EAS. pt reports she has had this her whole life but did have slight TTP below and to Lt of this, no redness or irritation noted. Pt reports she has an appointment 11/2 with her referring provider and will bring this up at this time.    Internal Pelvic Floor rectal treatment completed this session with pt's consent to improve pain management at coccyx and decrease muscle tightness. Pt had TTP at Lt puborectalis, pubococcygeus only this session, decreased tightness noted and no TTP at coccyx per pt.                     PT Education - 04/16/21 1626     Education Details Pt educated on external stretching for decreased tenderness/tightness at coccyx and pt verbalized understanding, finding with skin opening at gluteal crease, and decreased anal wrinkling at posterior EAS as this is first time assessed in prone. Pt agreed to bring up at upcoming doctor's appointment.    Person(s) Educated Patient    Methods Explanation;Demonstration;Tactile cues;Verbal cues    Comprehension Verbalized understanding;Returned demonstration              PT Short Term Goals - 02/18/21 1708       PT SHORT TERM GOAL #1   Title Pt to be I with HEP    Time 5    Period Weeks    Status New    Target Date 03/25/21      PT SHORT TERM GOAL #2   Title pt to report no more than6/10 pain at coccyx with sitting to increase tolerance to 30 mins without ice pack    Time 5    Period Weeks    Status New    Target Date 03/25/21      PT SHORT TERM GOAL #3   Title pt to  demonstrate improved bil hip pain to at least 4/5 globally for improved functional mobility and decreased compensation    Time 5    Period Weeks    Status New    Target Date 03/25/21      PT SHORT TERM GOAL #4   Title pt to tolerate vaginal dilator size 4 or equivalent for improved tolerance  to vaginal penetration    Time 5    Period Weeks    Status New    Target Date 03/25/21               PT Long Term Goals - 02/18/21 1711       PT LONG TERM GOAL #1   Baseline pt to be I with advanced HEP    Time 4    Period Months    Status New    Target Date 06/20/21      PT LONG TERM GOAL #2   Title pt to tolerate vaginal dilator size 6 or equivalent for improved tolerance to vaginal penetration    Time 4    Period Months    Status New    Target Date 06/20/21      PT LONG TERM GOAL #3   Title pt to be I with self correction of proper breathing mechanics and core/pelvic floor contract/relaxation with mobility to decrease compensatory strategies    Time 4    Period Months    Status New    Target Date 06/20/21      PT LONG TERM GOAL #4   Title pt to demonstrate improved bil hip pain to at least 455 globally for improved functional mobility and decreased compensation    Time 4    Period Months    Status New    Target Date 06/20/21      PT LONG TERM GOAL #5   Title pt to report no more than 2/10 pain at coccyx with sitting to increase tolerance to 30 mins without ice pack    Time 4    Period Months    Status New    Target Date 06/20/21                   Plan - 04/16/21 1635     Clinical Impression Statement Pt presents to clinic with improvement at coccyx pain, now down to 4/10 and tolerating more time in sitting without ice pack. Pt session focused on educated on external stretching of gluteals and levator ani for home and to focus on this instead of internal work at home as pt attempted and reported this was difficult; and internal rectal treatment provided  this session and only x2 TTP this session at Lt pubococcygeus and puborectalis, no TTP at coccyx. Pt then directed into prone for manual work at cocyx, externally and small skin opening noted at gluteal crease found by PT and pt reported she has had this her whole lift, no irritation noted but TTP at Lt and posterior to it. Pt directed to tell provider at next appointment 11/2 and she agreed, pt also found to have decreased anal wrinkling at posterior EAS in prone as well. Addaday used on bil gluteals over jeans at end of session to decrease bil muscle tension and strain at coccyx. Pt denied additional questions and reported feeling better at end of session. Pt would benefit from continued PT for improvement with deficits found on eval and pain.    Personal Factors and Comorbidities Time since onset of injury/illness/exacerbation;Comorbidity 2;Fitness    Comorbidities x2 vaginal births, tearing with one, and history of injury to coccyx with falling on it ~2 years ago    Examination-Activity Limitations Sit;Sleep    Examination-Participation Restrictions Interpersonal Relationship;Yard Work;Community Activity;Driving;Occupation    Stability/Clinical Decision Making Evolving/Moderate complexity    Rehab Potential Good    PT Frequency 2x / week  PT Duration Other (comment)   10 weeks   PT Treatment/Interventions ADLs/Self Care Home Management;Aquatic Therapy;Functional mobility training;Therapeutic exercise;Traction;Neuromuscular re-education;Manual techniques;Patient/family education;Scar mobilization;Passive range of motion;Dry needling;Energy conservation;Visual/perceptual remediation/compensation;Vasopneumatic Device;Taping;Spinal Manipulations;Joint Manipulations    PT Next Visit Plan coccyx mobility, stretching    PT Home Exercise Plan Access Code: [email or web address]    Consulted and Agree with Plan of Care Patient             Patient will benefit from skilled therapeutic intervention in order to  improve the following deficits and impairments:  Decreased coordination, Increased fascial restricitons, Decreased endurance, Pain, Postural dysfunction, Improper body mechanics, Impaired flexibility, Decreased mobility, Decreased strength  Visit Diagnosis: Cramp and spasm  Muscle weakness (generalized)     Problem List Patient Active Problem List   Diagnosis Date Noted   Somatic dysfunction of spine, sacral 02/12/2021   Right leg swelling 05/02/2019   Coccyx pain 01/10/2019   Onychomycosis 01/10/2019   Palpitations 02/03/2017   Factor V Leiden ([place]) 02/03/2017   Left bundle branch block (LBBB) 02/03/2017   Vaginal atrophy 12/03/2016   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

## 2021-04-23 ENCOUNTER — Encounter: Payer: PPO | Admitting: Physical Therapy

## 2021-04-23 DIAGNOSIS — L820 Inflamed seborrheic keratosis: Secondary | ICD-10-CM | POA: Diagnosis not present

## 2021-04-24 ENCOUNTER — Other Ambulatory Visit: Payer: Self-pay

## 2021-04-24 ENCOUNTER — Ambulatory Visit: Payer: PPO | Attending: Family Medicine | Admitting: Physical Therapy

## 2021-04-24 DIAGNOSIS — R252 Cramp and spasm: Secondary | ICD-10-CM

## 2021-04-24 DIAGNOSIS — R279 Unspecified lack of coordination: Secondary | ICD-10-CM | POA: Diagnosis not present

## 2021-04-24 DIAGNOSIS — M6281 Muscle weakness (generalized): Secondary | ICD-10-CM | POA: Diagnosis not present

## 2021-04-24 NOTE — Therapy (Signed)
[facility] [place], [place], [address] Phone: [phone]   Fax:  [phone]  Physical Therapy Treatment  Patient Details  Name: Kayla [name] MRN: [id number] Date of Birth: July 22, 1950 Referring Provider (PT): [name], DO   Encounter Date: 04/24/2021   PT End of Session - 04/24/21 1709     Visit Number 9    Date for PT Re-Evaluation 06/20/21    Authorization Type health team    PT Start Time 9937    PT Stop Time 1696    PT Time Calculation (min) 42 min    Activity Tolerance Patient tolerated treatment well;No increased pain    Behavior During Therapy [facility] for tasks assessed/performed             No past medical history on file.  Past Surgical History:  Procedure Laterality Date   APPENDECTOMY     age 52   FOOT SURGERY Bilateral    TONSILLECTOMY     age 66    There were no vitals filed for this visit.   Subjective Assessment - 04/24/21 1627     Subjective Pt reports she has had great improvement with pain overall since last session, no longer using cushion or ice in car while driving or at home but does use a support seat with small opening at center for mild pressure relief at coccyx however previously was using this and cushion and ice pack for all sitting on all surfaces. Pt reports she was sitting for several hours today flying with husband and having dinner then returning so pain is 7/10 but prior to this has been much lower. Pt agreeable to attempt next two weeks with once a week sessions.                    No emotional/communication barriers or cognitive limitation. Patient is motivated to learn. Patient understands and agrees with treatment goals and plan. PT explains patient will be examined in standing, sitting, and lying down to see how their muscles and joints work. When they are ready, they will be asked to remove their underwear so PT can examine their  perineum. The patient is also given the option of providing their own chaperone as one is not provided in our facility. The patient also has the right and is explained the right to defer or refuse any part of the evaluation or treatment including the internal exam. With the patient's consent, PT will use one gloved finger to gently assess the muscles of the pelvic floor, seeing how well it contracts and relaxes and if there is muscle symmetry. After, the patient will get dressed and PT and patient will discuss exam findings and plan of care. PT and patient discuss plan of care, schedule, attendance policy and HEP activities.            [facility] Adult PT Treatment/Exercise - 04/24/21 0001       Self-Care   Self-Care Other Self-Care Comments    Other Self-Care Comments  Pt educated on continued ways to stretch at coccyx in pain free positions from knees,ankles and how to manually stretch externally at coccyx with ~45s holds x2-3 for decreased tightness. Pt demonsrtated good recall and technique.      Manual Therapy   Manual Therapy Internal Pelvic Floor;Soft tissue mobilization    Soft tissue mobilization Manual therapy provided from PT externally post internal treatment once pt clothed for sacral springing and  release at Lt piriformis and posterior SI ligament region. Pt reported greatly improved pain at end of session.    Internal Pelvic Floor rectal treatment completed this session with pt's consent to improve pain management post several hours sitting today and to decrease muscle tightness. Pt denied TTP at Lt and Rt side upon assessment and did not have tenderness at coccyx .Internal treatment halted as pt did not demonstrate tightness or pain as her usual pain sites with muscle tightness.                     PT Education - 04/24/21 1708     Education Details Pt educated on external stretches for coccyx pain to continue from previous sessions, and 2-3x45s for manual stretching  externally at Lt side of coccyx to improve mobility and decrease pain and tension. Pt also educated on lubricants and moisturizers.    Person(s) Educated Patient    Methods Explanation;Demonstration;Tactile cues;Verbal cues;Handout    Comprehension Verbalized understanding;Returned demonstration              PT Short Term Goals - 04/24/21 1712       PT SHORT TERM GOAL #1   Title Pt to be I with HEP    Time 5    Period Weeks    Status On-going    Target Date 03/25/21      PT SHORT TERM GOAL #2   Title pt to report no more than6/10 pain at coccyx with sitting to increase tolerance to 30 mins without ice pack    Time 5    Period Weeks    Status Achieved    Target Date 03/25/21      PT SHORT TERM GOAL #3   Title pt to demonstrate improved bil hip pain to at least 4/5 globally for improved functional mobility and decreased compensation    Time 5    Period Weeks    Status On-going    Target Date 03/25/21      PT SHORT TERM GOAL #4   Title pt to tolerate vaginal dilator size 4 or equivalent for improved tolerance to vaginal penetration    Time 5    Period Weeks    Status On-going    Target Date 03/25/21               PT Long Term Goals - 02/18/21 1711       PT LONG TERM GOAL #1   Baseline pt to be I with advanced HEP    Time 4    Period Months    Status New    Target Date 06/20/21      PT LONG TERM GOAL #2   Title pt to tolerate vaginal dilator size 6 or equivalent for improved tolerance to vaginal penetration    Time 4    Period Months    Status New    Target Date 06/20/21      PT LONG TERM GOAL #3   Title pt to be I with self correction of proper breathing mechanics and core/pelvic floor contract/relaxation with mobility to decrease compensatory strategies    Time 4    Period Months    Status New    Target Date 06/20/21      PT LONG TERM GOAL #4   Title pt to demonstrate improved bil hip pain to at least 455 globally for improved functional  mobility and decreased compensation    Time 4    Period Months  Status New    Target Date 06/20/21      PT LONG TERM GOAL #5   Title pt to report no more than 2/10 pain at coccyx with sitting to increase tolerance to 30 mins without ice pack    Time 4    Period Months    Status New    Target Date 06/20/21                   Plan - 04/24/21 1709     Clinical Impression Statement Pt presents to clinic reporting overall greatly improved pain and increased tolerance to sitting with decreasing amount of cushion and ice to sit on, no none at home or in car other than light cushion in seat for drivining with small cut out at center. Pt reports today she spend several hours sitting and now having pain but improved at end of session which focused on manual therapy externally and internally though no tightness/TTP internally then treatment stopped and focused on external manual work at sacrum and pt responded well to this, decreased pain. Pt did report she was having chronic pain with intercourse prior to coccyx pain at vaginal pain due to dryness and given samples of lubricants and moisturizers with handouts to see if this improves. Pt would benefit from continued PT for improvement with deficits found on eval and pain.    Personal Factors and Comorbidities Time since onset of injury/illness/exacerbation;Comorbidity 2;Fitness    Comorbidities x2 vaginal births, tearing with one, and history of injury to coccyx with falling on it ~2 years ago    Examination-Activity Limitations Sit;Sleep    Examination-Participation Restrictions Interpersonal Relationship;Yard Work;Community Activity;Driving;Occupation    Stability/Clinical Decision Making Evolving/Moderate complexity    Rehab Potential Good    PT Frequency 2x / week    PT Duration Other (comment)   10 weeks   PT Treatment/Interventions ADLs/Self Care Home Management;Aquatic Therapy;Functional mobility training;Therapeutic  exercise;Traction;Neuromuscular re-education;Manual techniques;Patient/family education;Scar mobilization;Passive range of motion;Dry needling;Energy conservation;Visual/perceptual remediation/compensation;Vasopneumatic Device;Taping;Spinal Manipulations;Joint Manipulations    PT Next Visit Plan coccyx mobility, stretching, vaginal assessment?    PT Home Exercise Plan Access Code: [email or web address]    Consulted and Agree with Plan of Care Patient             Patient will benefit from skilled therapeutic intervention in order to improve the following deficits and impairments:  Decreased coordination, Increased fascial restricitons, Decreased endurance, Pain, Postural dysfunction, Improper body mechanics, Impaired flexibility, Decreased mobility, Decreased strength  Visit Diagnosis: Cramp and spasm  Muscle weakness (generalized)  Lack of coordination     Problem List Patient Active Problem List   Diagnosis Date Noted   Somatic dysfunction of spine, sacral 02/12/2021   Right leg swelling 05/02/2019   Coccyx pain 01/10/2019   Onychomycosis 01/10/2019   Palpitations 02/03/2017   Factor V Leiden ([place]) 02/03/2017   Left bundle branch block (LBBB) 02/03/2017   Vaginal atrophy 12/03/2016   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

## 2021-04-24 NOTE — Patient Instructions (Signed)
Moisturizers They are used in the vagina to hydrate the mucous membrane that make up the vaginal canal. Designed to keep a more normal acid balance (ph) Once placed in the vagina, it will last between two to three days.  Use 2-3 times per week at bedtime  Ingredients to avoid is glycerin and fragrance, can increase chance of infection Should not be used just before sex due to causing irritation Most are gels administered either in a tampon-shaped applicator or as a vaginal suppository. They are non-hormonal.   Types of Moisturizers(internal use)  Vitamin E vaginal suppositories- Whole foods, Amazon Moist Again Coconut oil- can break down condoms Julva- (Do no use if on Tamoxifen) amazon Yes moisturizer- amazon NeuEve Silk , NeuEve Silver for menopausal or over 65 (if have severe vaginal atrophy or cancer treatments use NeuEve Silk for  1 month than move to [employer])- [employer], [place].com Olive and Bee intimate cream- www.oliveandbee.com.au Mae vaginal moisturizer- Amazon Aloe    Creams to use externally on the Vulva area Albertson's (good for for cancer patients that had radiation to the area)- [place] or [employer].FlyingBasics.com.br V-magic cream - amazon Julva-amazon Vital "V Wild Yam salve ( help moisturize and help with thinning vulvar area, does have Lawrence by Irwin Brakeman labial moisturizer (Amazon,  Coconut or olive oil aloe   Things to avoid in the vaginal area Do not use things to irritate the vulvar area No lotions just specialized creams for the vulva area- Neogyn, V-magic, No soaps; can use Aveeno or Calendula cleanser if needed. Must be gentle No deodorants No douches Good to sleep without underwear to let the vaginal area to air out No scrubbing: spread the lips to let warm water rinse over labias and pat dry    Lubrication Used for intercourse to reduce friction Avoid ones that  have glycerin, warming gels, tingling gels, icing or cooling gel, scented Avoid parabens due to a preservative similar to female sex hormone May need to be reapplied once or several times during sexual activity Can be applied to both partners genitals prior to vaginal penetration to minimize friction or irritation Prevent irritation and mucosal tears that cause post coital pain and increased the risk of vaginal and urinary tract infections Oil-based lubricants cannot be used with condoms due to breaking them down.  Least likely to irritate vaginal tissue.  Plant based-lubes are safe Silicone-based lubrication are thicker and last long and used for post-menopausal women  Vaginal Lubricators Here is a list of some suggested lubricators you can use for intercourse. Use the most hypoallergenic product.  You can place on you or your partner.  Slippery Stuff ( water based) Sylk or Sliquid Natural H2O ( good  if frequent UTI's)- walmart, amazon Sliquid organics silk-(aloe and silicone based ) [employer] [place] [employer] (www.blossom-organics.com)- (aloe based ) Coconut oil, olive oil -not good with condoms  PJur Woman Nude- (water based) amazon Uberlube- ( silicon) Lomas has an organic one Yes lubricant- (water based and has plant oil based similar to silicone) [employer] Platinum-Silicone, Target, [facility] Olive and Bee intimate cream-  www.oliveandbee.com.au Pink - [employer] stuff Erosense Sync- walmart, amazon Coconu- FailLinks.co.uk  Things to avoid in lubricants are glycerin, warming gels, tingling gels, icing or cooling  gels, and scented gels.  Also avoid Vaseline. KY jelly, Replens, and Astroglide contain chlorhexidine which kills good bacteria(lactobacilli)  Things to avoid in the vaginal area Do not use  things to irritate the vulvar area No lotions- see below Soaps you  can use :Aveeno, Calendula, Good Clean Love cleanser if needed. Must be gentle No deodorants No  douches Good to sleep without underwear to let the vaginal area to air out No scrubbing: spread the lips to let warm water rinse over labias and pat dry  Creams that can be used on the Vulva Area V [employer] [place] [employer], walmart Vital V Wild Yam Salve Julva- [employer] Botanical Pro-Meno Wild Yam Cream Coconut oil, olive oil Cleo by [employer] labial moisturizer -[place],  [place] Releveum ( lidocaine) or Desert [employer] Yes Moisturizer

## 2021-04-28 ENCOUNTER — Encounter: Payer: PPO | Admitting: Physical Therapy

## 2021-04-30 ENCOUNTER — Ambulatory Visit: Payer: PPO | Admitting: Physical Therapy

## 2021-04-30 ENCOUNTER — Other Ambulatory Visit: Payer: Self-pay

## 2021-04-30 DIAGNOSIS — R252 Cramp and spasm: Secondary | ICD-10-CM

## 2021-04-30 DIAGNOSIS — M6281 Muscle weakness (generalized): Secondary | ICD-10-CM

## 2021-04-30 NOTE — Therapy (Signed)
[facility] @ [place], [place], [address] Phone: [phone]   Fax:  [phone]  Physical Therapy Treatment  Patient Details  Name: Kayla [name] MRN: [id number] Date of Birth: [birth year] Referring Provider (PT): [name], DO   Encounter Date: 04/30/2021   PT End of Session - 04/30/21 1616     Visit Number 10    Date for PT Re-Evaluation 06/20/21    Authorization Type health team    PT Start Time 6010    PT Stop Time 1613    PT Time Calculation (min) 43 min    Activity Tolerance Patient tolerated treatment well    Behavior During Therapy [facility] for tasks assessed/performed             No past medical history on file.  Past Surgical History:  Procedure Laterality Date   APPENDECTOMY     age 70   FOOT SURGERY Bilateral    TONSILLECTOMY     age 70    There were no vitals filed for this visit.                   Pelvic Floor Special Questions - 04/30/21 0001     Pelvic Floor Internal Exam patient identified and patient confirms consent for PT to perform internal soft tissue work and muscle strength and integrity assessment    Exam Type Vaginal    Sensation WFL    Palpation no TTP externally but TTP at Lt ischiocavernosus, improved with release and additional TTP at pubococcygeus and iliococcygeus though each released with extra time and triger point and pt reported improved pain levels.    Strength good squeeze, good lift, able to hold agaisnt strong resistance               [facility] Adult PT Treatment/Exercise - 04/30/21 0001       Self-Care   Self-Care Other Self-Care Comments    Other Self-Care Comments  Pt educated on pelvic wand options and how to use, onhut products to decrease pelvic pain with intercourse.handouts given as well.      Manual Therapy   Manual Therapy Internal Pelvic Floor;Soft tissue mobilization    Internal Pelvic Floor vaginal  treatment/assessment this date as pt reports her coccyx has been improving and she would like to assess vaginally as she does have long (3+year) history of pain with intercourse/vaginal penetration. pt found to have tightness and TTP at Lt side of pelvis, trigger point released at ischiocavernosus, pubococcygeus, and iliococcygeus, progressing into deeper layers of pelvic floor as pt's pain released and per her tolerance. pt reported greatly improved pain compared to initial insertion of sharp pain to dull achy felt at deeper layer of muscles at end of session.                     PT Education - 04/30/21 1615     Education Details pt educated on pelvic wand, ohnut products, various lubricants, stretching internally and how to complete at home with spouse to assist as tolerated, and continued stretching for coccyx.    Person(s) Educated Patient    Methods Explanation;Demonstration;Tactile cues;Verbal cues;Handout    Comprehension Verbalized understanding;Returned demonstration              PT Short Term Goals - 04/24/21 1712       PT SHORT TERM GOAL #1   Title Pt to be I with HEP  Time 5    Period Weeks    Status On-going    Target Date 03/25/21      PT SHORT TERM GOAL #2   Title pt to report no more than6/10 pain at coccyx with sitting to increase tolerance to 30 mins without ice pack    Time 5    Period Weeks    Status Achieved    Target Date 03/25/21      PT SHORT TERM GOAL #3   Title pt to demonstrate improved bil hip pain to at least 4/5 globally for improved functional mobility and decreased compensation    Time 5    Period Weeks    Status On-going    Target Date 03/25/21      PT SHORT TERM GOAL #4   Title pt to tolerate vaginal dilator size 4 or equivalent for improved tolerance to vaginal penetration    Time 5    Period Weeks    Status On-going    Target Date 03/25/21               PT Long Term Goals - 02/18/21 1711       PT LONG TERM GOAL  #1   Baseline pt to be I with advanced HEP    Time 4    Period Months    Status New    Target Date 06/20/21      PT LONG TERM GOAL #2   Title pt to tolerate vaginal dilator size 6 or equivalent for improved tolerance to vaginal penetration    Time 4    Period Months    Status New    Target Date 06/20/21      PT LONG TERM GOAL #3   Title pt to be I with self correction of proper breathing mechanics and core/pelvic floor contract/relaxation with mobility to decrease compensatory strategies    Time 4    Period Months    Status New    Target Date 06/20/21      PT LONG TERM GOAL #4   Title pt to demonstrate improved bil hip pain to at least 455 globally for improved functional mobility and decreased compensation    Time 4    Period Months    Status New    Target Date 06/20/21      PT LONG TERM GOAL #5   Title pt to report no more than 2/10 pain at coccyx with sitting to increase tolerance to 30 mins without ice pack    Time 4    Period Months    Status New    Target Date 06/20/21                   Plan - 04/30/21 1616     Clinical Impression Statement Pt presents to clinic reporting very pleased with progress with coccyx pain, has continued to improve and only cushion for longer periods of time sitting on uncomfortable surface.Pt reports she is also complaint with stretching at home as well and this remains to help. Pt reports as pain is improving with coccyx she would like to assess vaginally today as she continues to have pain with intercourse and has for several years, even before coccyx pain though this was much worse on her than pain with penetration and wanted that addressed first. Pt consented to internal vaginal assessment and treatment this date and found to have tightness at Lt side of pelvis with sharp pain with penetration. Pt reports she does  use dilators at home intermittently to improve tolerance to sizing but is unable to complete 3 size in her set, brought  to clinic and reports she is only able to tolerate about half distance inserted due to pain. PT provided trigger point release internally with improvement of pain reported by pt and decrease tension felt manually. Pt tolerated well and requested to attempt vaginal dilator from home and did still have pain internally with this at same place on lt side but greatly less pai overall. Pt educated on pelvic wand, ohnut, and stretching for home vaginally as tolerated and to not attempt with pain levels over 3-4/10. Pt agreed and denied additional questions.    Personal Factors and Comorbidities Time since onset of injury/illness/exacerbation;Comorbidity 2;Fitness    Comorbidities x2 vaginal births, tearing with one, and history of injury to coccyx with falling on it ~2 years ago    Examination-Activity Limitations Sit;Sleep    Examination-Participation Restrictions Interpersonal Relationship;Yard Work;Community Activity;Driving;Occupation    Stability/Clinical Decision Making Evolving/Moderate complexity    Rehab Potential Good    PT Frequency 2x / week    PT Duration Other (comment)   10 weeks   PT Treatment/Interventions ADLs/Self Care Home Management;Aquatic Therapy;Functional mobility training;Therapeutic exercise;Traction;Neuromuscular re-education;Manual techniques;Patient/family education;Scar mobilization;Passive range of motion;Dry needling;Energy conservation;Visual/perceptual remediation/compensation;Vasopneumatic Device;Taping;Spinal Manipulations;Joint Manipulations    PT Next Visit Plan coccyx mobility, stretching,    PT Home Exercise Plan Access Code: [email or web address]    Consulted and Agree with Plan of Care Patient             Patient will benefit from skilled therapeutic intervention in order to improve the following deficits and impairments:  Decreased coordination, Increased fascial restricitons, Decreased endurance, Pain, Postural dysfunction, Improper body mechanics, Impaired flexibility,  Decreased mobility, Decreased strength  Visit Diagnosis: Cramp and spasm  Muscle weakness (generalized)     Problem List Patient Active Problem List   Diagnosis Date Noted   Somatic dysfunction of spine, sacral 02/12/2021   Right leg swelling 05/02/2019   Coccyx pain 01/10/2019   Onychomycosis 01/10/2019   Palpitations 02/03/2017   Factor V Leiden ([place]) 02/03/2017   Left bundle branch block (LBBB) 02/03/2017   Vaginal atrophy 12/03/2016   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 Patient is motivated to learn. Patient understands and agrees with treatment goals and plan. PT explains patient will be examined in standing, sitting, and lying down to see how their muscles and joints work. When they are ready, they will be asked to remove their underwear so PT can examine their perineum. The patient is also given the option of providing their own chaperone as one is not provided in our facility. The patient also has the right and is explained the right to defer or refuse any part of the evaluation or treatment including the internal exam. With the patient's consent, PT will use one gloved finger to gently assess the muscles of the pelvic floor, seeing how well it contracts and relaxes and if there is muscle symmetry. After, the patient will get dressed and PT and patient will discuss exam findings and plan of care. PT and patient discuss plan of care, schedule, attendance policy and HEP activities.   Stacy Gardner, PT, DPT 11/09/224:36 PM    [facility] @ [address] [place], [place], [address] Phone: [phone]   Fax:  [phone]  Name: Jiali Linney Prange MRN: [id number] Date of Birth: [birth year]

## 2021-05-05 ENCOUNTER — Encounter: Payer: PPO | Admitting: Physical Therapy

## 2021-05-07 ENCOUNTER — Other Ambulatory Visit: Payer: Self-pay

## 2021-05-07 ENCOUNTER — Ambulatory Visit: Payer: PPO | Admitting: Physical Therapy

## 2021-05-07 DIAGNOSIS — M6281 Muscle weakness (generalized): Secondary | ICD-10-CM

## 2021-05-07 DIAGNOSIS — R279 Unspecified lack of coordination: Secondary | ICD-10-CM

## 2021-05-07 DIAGNOSIS — R252 Cramp and spasm: Secondary | ICD-10-CM | POA: Diagnosis not present

## 2021-05-07 NOTE — Therapy (Signed)
[facility] @ [place] [place], [place], [address] Phone: [phone]   Fax:  [phone]  Physical Therapy Treatment  Patient Details  Name: Kayla [name] MRN: [id number] Date of Birth: 1951-02-02 Referring Provider (PT): [name], DO   Encounter Date: 05/07/2021   PT End of Session - 05/07/21 1443     Visit Number 11    Date for PT Re-Evaluation 06/20/21    Authorization Type health team    PT Start Time 1402    PT Stop Time 1445    PT Time Calculation (min) 43 min    Activity Tolerance Patient tolerated treatment well    Behavior During Therapy [facility] for tasks assessed/performed             No past medical history on file.  Past Surgical History:  Procedure Laterality Date   APPENDECTOMY     age 65   FOOT SURGERY Bilateral    TONSILLECTOMY     age 45    There were no vitals filed for this visit.   Subjective Assessment - 05/07/21 1402     Subjective Pt reports pain is better at coccyx; pt able to have intercourse x2 since last visit for first time in over 2 years. Pt reports it was better after stretching with dilators and use of lubricant.    How long can you sit comfortably? with cushion ~1.5 hours no ice pack    How long can you stand comfortably? no limits    How long can you walk comfortably? no limits    Patient Stated Goals to have less pain    Currently in Pain? Yes    Pain Score 5     Pain Location Coccyx    Pain Orientation Left    Pain Descriptors / Indicators Burning                               [facility] Adult PT Treatment/Exercise - 05/07/21 0001       Self-Care   Self-Care Other Self-Care Comments    Other Self-Care Comments  Pt educated on abdominal massage, various lubricant and moisturizer brands, and to promote meditation or relaxive techniques to decrease tension at glutes, jaw to decrease pelvic pain and tightness.      Manual Therapy    Manual therapy comments abdominal massage in supine completed as pt reported some increased pelvic discomfort with constipation. Pt also lead in manual work and addaday used at [employer] for improved tissue mobility and decreased tension.                       PT Short Term Goals - 04/24/21 1712       PT SHORT TERM GOAL #1   Title Pt to be I with HEP    Time 5    Period Weeks    Status On-going    Target Date 03/25/21      PT SHORT TERM GOAL #2   Title pt to report no more than6/10 pain at coccyx with sitting to increase tolerance to 30 mins without ice pack    Time 5    Period Weeks    Status Achieved    Target Date 03/25/21      PT SHORT TERM GOAL #3   Title pt to demonstrate improved bil hip pain to at least 4/5 globally for improved  functional mobility and decreased compensation    Time 5    Period Weeks    Status On-going    Target Date 03/25/21      PT SHORT TERM GOAL #4   Title pt to tolerate vaginal dilator size 4 or equivalent for improved tolerance to vaginal penetration    Time 5    Period Weeks    Status On-going    Target Date 03/25/21               PT Long Term Goals - 02/18/21 1711       PT LONG TERM GOAL #1   Baseline pt to be I with advanced HEP    Time 4    Period Months    Status New    Target Date 06/20/21      PT LONG TERM GOAL #2   Title pt to tolerate vaginal dilator size 6 or equivalent for improved tolerance to vaginal penetration    Time 4    Period Months    Status New    Target Date 06/20/21      PT LONG TERM GOAL #3   Title pt to be I with self correction of proper breathing mechanics and core/pelvic floor contract/relaxation with mobility to decrease compensatory strategies    Time 4    Period Months    Status New    Target Date 06/20/21      PT LONG TERM GOAL #4   Title pt to demonstrate improved bil hip pain to at least 455 globally for improved functional mobility and decreased compensation    Time 4     Period Months    Status New    Target Date 06/20/21      PT LONG TERM GOAL #5   Title pt to report no more than 2/10 pain at coccyx with sitting to increase tolerance to 30 mins without ice pack    Time 4    Period Months    Status New    Target Date 06/20/21                   Plan - 05/07/21 1443     Clinical Impression Statement Pt presents to clinic reporting 5/10 pain at coccyx 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 Pt does repotr she was able to have intercourse x2 since last visit with pain the first time but improved second time as pt used dilators prior which helped alot. Pt reports she has also order OhNut products to help as well. Pt has problems with lubricants due to very senstive skin and is trying out various brands at this time. Pt given samples from clinic to attempt but instructed to stop with skin irritations, burning or itching and agreed. Pt also has tried coconut oil/olive oil with burning reported. Pt unable to estrogen based cream from MD due to other medical concerns as pt has asked for other recommendations. Pt session focused on manual work at [employer] and hip abductors with increased tension noted and glute clenching noted and pt reports she does this and grinds jaw often, educated on lessening this to decrease tension at pelvis and pt reported some discomfort with constipation and abdominal massage completed and pt given handout for this for home use with education on how/when/how often to complete. Pt would benefit from additional PT to address pelvic pain needs.    Personal Factors and Comorbidities Time since onset of injury/illness/exacerbation;Comorbidity  2;Fitness    Comorbidities x2 vaginal births, tearing with one, and history of injury to coccyx with falling on it ~2 years ago    Examination-Activity Limitations Sit;Sleep    Examination-Participation Restrictions Interpersonal Relationship;Yard Work;Community  Activity;Driving;Occupation    Stability/Clinical Decision Making Evolving/Moderate complexity    Rehab Potential Good    PT Frequency 2x / week    PT Duration Other (comment)   10 weeks   PT Treatment/Interventions ADLs/Self Care Home Management;Aquatic Therapy;Functional mobility training;Therapeutic exercise;Traction;Neuromuscular re-education;Manual techniques;Patient/family education;Scar mobilization;Passive range of motion;Dry needling;Energy conservation;Visual/perceptual remediation/compensation;Vasopneumatic Device;Taping;Spinal Manipulations;Joint Manipulations    PT Next Visit Plan coccyx mobility, stretching, vaginal trigger points as needed    PT Home Exercise Plan Access Code: [email or web address]    Consulted and Agree with Plan of Care Patient             Patient will benefit from skilled therapeutic intervention in order to improve the following deficits and impairments:  Decreased coordination, Increased fascial restricitons, Decreased endurance, Pain, Postural dysfunction, Improper body mechanics, Impaired flexibility, Decreased mobility, Decreased strength  Visit Diagnosis: Cramp and spasm  Muscle weakness (generalized)  Lack of coordination     Problem List Patient Active Problem List   Diagnosis Date Noted   Somatic dysfunction of spine, sacral 02/12/2021   Right leg swelling 05/02/2019   Coccyx pain 01/10/2019   Onychomycosis 01/10/2019   Palpitations 02/03/2017   Factor V Leiden ([place]) 02/03/2017   Left bundle branch block (LBBB) 02/03/2017   Vaginal atrophy 12/03/2016   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

## 2021-05-08 ENCOUNTER — Ambulatory Visit: Payer: PPO | Admitting: Sports Medicine

## 2021-05-08 ENCOUNTER — Ambulatory Visit: Payer: PPO | Admitting: Family Medicine

## 2021-05-08 VITALS — BP 118/82 | HR 76 | Ht 62.0 in | Wt 126.0 lb

## 2021-05-08 DIAGNOSIS — M9903 Segmental and somatic dysfunction of lumbar region: Secondary | ICD-10-CM

## 2021-05-08 DIAGNOSIS — M9904 Segmental and somatic dysfunction of sacral region: Secondary | ICD-10-CM | POA: Diagnosis not present

## 2021-05-08 DIAGNOSIS — N8189 Other female genital prolapse: Secondary | ICD-10-CM

## 2021-05-08 DIAGNOSIS — M533 Sacrococcygeal disorders, not elsewhere classified: Secondary | ICD-10-CM

## 2021-05-08 DIAGNOSIS — M9902 Segmental and somatic dysfunction of thoracic region: Secondary | ICD-10-CM

## 2021-05-08 DIAGNOSIS — M9901 Segmental and somatic dysfunction of cervical region: Secondary | ICD-10-CM

## 2021-05-08 DIAGNOSIS — M9905 Segmental and somatic dysfunction of pelvic region: Secondary | ICD-10-CM | POA: Diagnosis not present

## 2021-05-08 DIAGNOSIS — M9908 Segmental and somatic dysfunction of rib cage: Secondary | ICD-10-CM | POA: Diagnosis not present

## 2021-05-08 NOTE — Patient Instructions (Signed)
Good to see you   Follow up in 4 weeks for repeat OMT and to see if pelvic floor therapy should be extended

## 2021-05-08 NOTE — Progress Notes (Signed)
Kayla [name] D.[place] Phone: [phone]   Assessment and Plan:     1. Coccyx pain 2. Weakness of pelvic floor 3. Somatic dysfunction of cervical region 4. Somatic dysfunction of thoracic region 5. Somatic dysfunction of lumbar region 6. Somatic dysfunction of pelvic region 7. Somatic dysfunction of rib region 8. Somatic dysfunction of sacral region -Chronic with exacerbation, subsequent visit - Recurrent multiple musculoskeletal complaints with most prominent being lower back and sacrum - Patient is tolerating pelvic floor therapy well and has had increased strength.  Has 2 more visits scheduled - Patient has received significant relief with OMT in the past.  Elects for repeat OMT today.  Tolerated well per note below. - Decision today to treat with OMT was based on Physical Exam   After verbal consent patient was treated with HVLA (high velocity low amplitude), ME (muscle energy), FPR (flex positional release), ST (soft tissue), PC/PD (Pelvic Compression/ Pelvic Decompression) techniques in cervical, sacrum, rib, thoracic, lumbar, and pelvic areas. Patient tolerated the procedure well with improvement in symptoms.  Patient educated on potential side effects of soreness and recommended to rest, hydrate, and use Tylenol as needed for pain control.   Pertinent previous records reviewed include none   Follow Up: 4 weeks for repeat OMT and to discuss effectiveness of pelvic floor therapy and whether or not patient needs to continue   Subjective:   I, Kayla [name], am serving as a scribe for Dr. [name]  Chief Complaint: coccyx pain  HPI:   05/08/21 Patient is a 70 year old female presenting with coccyx pain. Patient was last seen by Dr. [name] on 03/26/21 for this reason and had OMT. Today patient states that she has been doing better since going to the pelvic floor therapist but her lower back has been  worse since doing the exercises the therapist gave her to do.   Relevant Historical Information: LBBB, factor V Leiden  Additional pertinent review of systems negative.  Current Outpatient Medications  Medication Sig Dispense Refill   Ascorbic Acid (VITAMIN C PO) Take 1 capsule by mouth daily.     aspirin 81 MG tablet Take 81 mg by mouth daily.     B Complex Vitamins (VITAMIN-B COMPLEX) TABS Take 1 tablet by mouth daily.     Docusate Calcium (STOOL SOFTENER PO) Take 1 capsule by mouth daily.     fish oil-omega-3 fatty acids 1000 MG capsule Take 2 g by mouth daily.     ibuprofen (ADVIL,MOTRIN) 200 MG tablet Take 400 mg by mouth as needed for pain.     MULTIPLE VITAMIN PO Take 1 tablet by mouth daily.     PARoxetine (PAXIL) 10 MG tablet Take 10 mg by mouth daily.     Polyethylene Glycol POWD 1 g by Does not apply route daily.     gabapentin (NEURONTIN) 100 MG capsule Take 2 capsules (200 mg total) by mouth at bedtime. (Patient not taking: Reported on 05/08/2021) 180 capsule 0   No current facility-administered medications for this visit.      Objective:     Vitals:   05/08/21 1450  BP: 118/82  Pulse: 76  SpO2: 99%  Weight: 126 lb (57.2 kg)  Height: 5\' 2"  (1.575 m)      Body mass index is 23.05 kg/m.    Physical Exam:     General: Well-appearing, cooperative, sitting comfortably in no acute distress.   OMT Physical Exam:  ASIS Compression Test: Positive Right Cervical: TTP paraspinal, C3 RRSL Rib: Bilateral elevated first rib with TTP Thoracic: TTP paraspinal, T4-8 RLSR Lumbar: TTP paraspinal, L1-3 RRSL Pelvis: Right anterior innominate Sacrum: Positive sphinx.  TTP bilateral SI  Electronically signed by:  Kayla [name] D.[name] 3:17 PM 05/08/21

## 2021-05-09 ENCOUNTER — Ambulatory Visit: Payer: PPO | Admitting: Family Medicine

## 2021-05-12 ENCOUNTER — Encounter: Payer: PPO | Admitting: Physical Therapy

## 2021-05-14 ENCOUNTER — Encounter: Payer: PPO | Admitting: Physical Therapy

## 2021-05-19 ENCOUNTER — Ambulatory Visit: Payer: PPO | Admitting: Physical Therapy

## 2021-05-21 ENCOUNTER — Other Ambulatory Visit: Payer: Self-pay

## 2021-05-21 ENCOUNTER — Ambulatory Visit: Payer: PPO | Admitting: Physical Therapy

## 2021-05-21 DIAGNOSIS — R279 Unspecified lack of coordination: Secondary | ICD-10-CM

## 2021-05-21 DIAGNOSIS — R252 Cramp and spasm: Secondary | ICD-10-CM | POA: Diagnosis not present

## 2021-05-21 DIAGNOSIS — M6281 Muscle weakness (generalized): Secondary | ICD-10-CM

## 2021-05-21 NOTE — Therapy (Signed)
[facility] [place], [place], [address] Phone: [phone]   Fax:  [phone]  Physical Therapy Treatment  Patient Details  Name: Kayla [name] MRN: [id number] Date of Birth: Sep 22, 1950 Referring Provider (PT): [name], DO   Encounter Date: 05/21/2021   PT End of Session - 05/21/21 1529     Visit Number 12    Date for PT Re-Evaluation 06/20/21    Authorization Type health team    PT Start Time 5956    PT Stop Time 1526    PT Time Calculation (min) 41 min    Activity Tolerance Patient tolerated treatment well    Behavior During Therapy [facility] for tasks assessed/performed             No past medical history on file.  Past Surgical History:  Procedure Laterality Date   APPENDECTOMY     age 52   FOOT SURGERY Bilateral    TONSILLECTOMY     age 32    There were no vitals filed for this visit.   Subjective Assessment - 05/21/21 1450     Subjective Pt reports her and her husband have been using Ohnut product during intercourse and has been helping a lot with decreased pain, also has purchased vibrating ring for Ohnut product attachment and uses this for dilator use at home as well which greatly improved pain vaginally with intercourse. Pt now using largest size of dilator with vibrating to assist in relaxation before intercourse with greatly improved pain levels. Vaginal pain 5/10 with largest dilator and lowers with vibrating massager then allowing for intercourse. 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    How long can you sit comfortably? 2.5h with just cushion    Patient Stated Goals to have less pain    Currently in Pain? Yes    Pain Score 3     Pain Location Coccyx                   No emotional/communication barriers or cognitive limitation.  Patient is motivated to learn. Patient understands and agrees with treatment goals and plan. PT explains patient will be examined in standing, sitting, and lying down to see how their muscles and joints work. When they are ready, they will be asked to remove their underwear so PT can examine their perineum. The patient is also given the option of providing their own chaperone as one is not provided in our facility. The patient also has the right and is explained the right to defer or refuse any part of the evaluation or treatment including the internal exam. With the patient's consent, PT will use one gloved finger to gently assess the muscles of the pelvic floor, seeing how well it contracts and relaxes and if there is muscle symmetry. After, the patient will get dressed and PT and patient will discuss exam findings and plan of care. PT and patient discuss plan of care, schedule, attendance policy and HEP activities.          Pelvic Floor Special Questions - 05/21/21 0001     Pelvic Floor Internal Exam patient identified and patient confirms consent for PT to perform internal soft tissue work and muscle strength and integrity assessment    Exam Type Vaginal;Rectal    Sensation [facility]    Palpation  pt requested internall assessment to see if manual work her and spouse has been working on at home has been helpfull. Rectally and vaginally no TTP throughout assessment. Vaginally pt directed in x10 pelvic contractions, Z61 quick flicks, and 0R6E holds with pt demonstrating 4/5 strength with all except holds which decreased to 3 after 3s holds.    Strength good squeeze, good lift, able to hold agaisnt strong resistance    Strength # of reps 5    Strength # of seconds 3    Tone fair               [facility] Adult PT Treatment/Exercise - 05/21/21 0001       Manual Therapy   Manual Therapy Internal Pelvic Floor;Soft tissue mobilization    Internal Pelvic Floor pt requested internall assessment to see if  manual work her and spouse has been working on at home has been helpfull. Rectally and vaginally no TTP throughout assessment. Vaginally pt directed in x10 pelvic contractions, A54 quick flicks, and 0J8J holds with pt demonstrating 4/5 strength with all except holds which decreased to 3 after 3s holds.                     PT Education - 05/21/21 1527     Education Details Pt educated to continue home use of ohnut products, lidocaine lubricant pt reports helps, and HEP with dialtor usevaginally  and exercises to improve pain    Person(s) Educated Patient    Methods Explanation;Tactile cues;Demonstration;Verbal cues    Comprehension Verbalized understanding;Returned demonstration              PT Short Term Goals - 04/24/21 1712       PT SHORT TERM GOAL #1   Title Pt to be I with HEP    Time 5    Period Weeks    Status On-going    Target Date 03/25/21      PT SHORT TERM GOAL #2   Title pt to report no more than6/10 pain at coccyx with sitting to increase tolerance to 30 mins without ice pack    Time 5    Period Weeks    Status Achieved    Target Date 03/25/21      PT SHORT TERM GOAL #3   Title pt to demonstrate improved bil hip pain to at least 4/5 globally for improved functional mobility and decreased compensation    Time 5    Period Weeks    Status On-going    Target Date 03/25/21      PT SHORT TERM GOAL #4   Title pt to tolerate vaginal dilator size 4 or equivalent for improved tolerance to vaginal penetration    Time 5    Period Weeks    Status On-going    Target Date 03/25/21               PT Long Term Goals - 02/18/21 1711       PT LONG TERM GOAL #1   Baseline pt to be I with advanced HEP    Time 4    Period Months    Status New    Target Date 06/20/21      PT LONG TERM GOAL #2   Title pt to tolerate vaginal dilator size 6 or equivalent for improved tolerance to vaginal penetration    Time 4    Period Months    Status New    Target  Date 06/20/21  PT LONG TERM GOAL #3   Title pt to be I with self correction of proper breathing mechanics and core/pelvic floor contract/relaxation with mobility to decrease compensatory strategies    Time 4    Period Months    Status New    Target Date 06/20/21      PT LONG TERM GOAL #4   Title pt to demonstrate improved bil hip pain to at least 455 globally for improved functional mobility and decreased compensation    Time 4    Period Months    Status New    Target Date 06/20/21      PT LONG TERM GOAL #5   Title pt to report no more than 2/10 pain at coccyx with sitting to increase tolerance to 30 mins without ice pack    Time 4    Period Months    Status New    Target Date 06/20/21                   Plan - 05/21/21 1529     Clinical Impression Statement Pt presents to clinic reporting improvement in overall tolerance to activity with pain levels no more than 5/10 with 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 Pt now only using cushion for pain no longer ice pack and pt reports usually pain is about a 3/10. Pt reports she has also been continuing use of vaginal dilator at largest size with spouse to assist and uses a massager to improve tissue relaxation at lt side of vaginal opening which improve pain levels prior to intercourse. Pt then able to progress to intercourse with spouse and use of lidocaine lubricant and much more tolerable. pt reports she is very pleased with progress and requests to attempt sessions every other week now to see if spacing out between can maintain progress. Pt session focused on internal vaginal and rectal treatment thought pt had no tightness or pain rectally and then stopped, changed glves and cleaned hands to then do vaginal assessment and treatment. pt tolerated well without pain and worked on contractions, relaxation, quick flicks and endurance with fair effect. Pt would benefit from  additional PT to address pelvic pain needs.    Personal Factors and Comorbidities Time since onset of injury/illness/exacerbation;Comorbidity 2;Fitness    Comorbidities x2 vaginal births, tearing with one, and history of injury to coccyx with falling on it ~2 years ago    Examination-Activity Limitations Sit;Sleep    Examination-Participation Restrictions Interpersonal Relationship;Yard Work;Community Activity;Driving;Occupation    Stability/Clinical Decision Making Evolving/Moderate complexity    Rehab Potential Good    PT Frequency 2x / week    PT Duration Other (comment)   10 weeks   PT Treatment/Interventions ADLs/Self Care Home Management;Aquatic Therapy;Functional mobility training;Therapeutic exercise;Traction;Neuromuscular re-education;Manual techniques;Patient/family education;Scar mobilization;Passive range of motion;Dry needling;Energy conservation;Visual/perceptual remediation/compensation;Vasopneumatic Device;Taping;Spinal Manipulations;Joint Manipulations    PT Next Visit Plan manual external work and stretching    PT Home Exercise Plan Access Code: [email or web address]    Consulted and Agree with Plan of Care Patient             Patient will benefit from skilled therapeutic intervention in order to improve the following deficits and impairments:  Decreased coordination, Increased fascial restricitons, Decreased endurance, Pain, Postural dysfunction, Improper body mechanics, Impaired flexibility, Decreased mobility, Decreased strength  Visit Diagnosis: Muscle weakness (generalized)  Lack of coordination     Problem List Patient Active Problem List   Diagnosis Date Noted  Somatic dysfunction of spine, sacral 02/12/2021   Right leg swelling 05/02/2019   Coccyx pain 01/10/2019   Onychomycosis 01/10/2019   Palpitations 02/03/2017   Factor V Leiden ([place]) 02/03/2017   Left bundle branch block (LBBB) 02/03/2017   Vaginal atrophy 12/03/2016   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

## 2021-05-30 NOTE — Progress Notes (Signed)
Kayla [name] D.[place] Phone: [phone]   Assessment and Plan:     1. Coccyx pain 2. Somatic dysfunction of thoracic region 3. Somatic dysfunction of lumbar region 4. Somatic dysfunction of pelvic region 5. Somatic dysfunction of rib region 6. Somatic dysfunction of sacral region -Chronic with exacerbation, subsequent visit - Significant improvement with pelvic floor therapy and multiple OMT visits - Patient has received significant relief with OMT in the past.  Elects for repeat OMT today.  Tolerated well per note below. - Decision today to treat with OMT was based on Physical Exam   After verbal consent patient was treated with HVLA (high velocity low amplitude), ME (muscle energy), FPR (flex positional release), ST (soft tissue), PC/PD (Pelvic Compression/ Pelvic Decompression) techniques in sacral, rib, thoracic, lumbar, and pelvic areas. Patient tolerated the procedure well with improvement in symptoms.  Patient educated on potential side effects of soreness and recommended to rest, hydrate, and use Tylenol as needed for pain control.   Pertinent previous records reviewed include none   Follow Up: 4 weeks for maintenance OMT   Subjective:    I, Kayla [name], am serving as a Education administrator for Doctor Kayla [name]  Chief Complaint: coccyx pain   HPI:   05/08/21 Patient is a 70 year old female presenting with coccyx pain. Patient was last seen by Dr. Tamala [name] on 03/26/21 for this reason and had OMT. Today patient states that she has been doing better since going to the pelvic floor therapist but her lower back has been worse since doing the exercises the therapist gave her to do.    06/02/2021  Patient states that she is doing really well     Relevant Historical Information: LBBB, factor V Leiden    Additional pertinent review of systems negative.  Current Outpatient Medications  Medication Sig Dispense  Refill   Ascorbic Acid (VITAMIN C PO) Take 1 capsule by mouth daily.     aspirin 81 MG tablet Take 81 mg by mouth daily.     B Complex Vitamins (VITAMIN-B COMPLEX) TABS Take 1 tablet by mouth daily.     Docusate Calcium (STOOL SOFTENER PO) Take 1 capsule by mouth daily.     fish oil-omega-3 fatty acids 1000 MG capsule Take 2 g by mouth daily.     gabapentin (NEURONTIN) 100 MG capsule Take 2 capsules (200 mg total) by mouth at bedtime. 180 capsule 0   ibuprofen (ADVIL,MOTRIN) 200 MG tablet Take 400 mg by mouth as needed for pain.     MULTIPLE VITAMIN PO Take 1 tablet by mouth daily.     PARoxetine (PAXIL) 10 MG tablet Take 10 mg by mouth daily.     Polyethylene Glycol POWD 1 g by Does not apply route daily.     No current facility-administered medications for this visit.      Objective:     Vitals:   06/02/21 1501  BP: 112/70  Pulse: 78  SpO2: 96%  Weight: 127 lb (57.6 kg)  Height: 5\' 2"  (1.575 m)      Body mass index is 23.23 kg/m.    Physical Exam:     General: Well-appearing, cooperative, sitting comfortably in no acute distress.   OMT Physical Exam:  ASIS Compression Test: Positive Right Sacrum: TTP left sacral base.  Left on right Rib: Inhalation dysfunction ribs 6-8 on right Thoracic: Increased kyphotic curve with limited rotation throughout thoracic spine Lumbar: Mildly TTP paraspinal  on right, L1-3 RRSL Pelvis: Right anterior innominate  Electronically signed by:  Kayla [name] D.[name] 3:33 PM 06/02/21

## 2021-06-02 ENCOUNTER — Ambulatory Visit: Payer: PPO | Admitting: Sports Medicine

## 2021-06-02 ENCOUNTER — Other Ambulatory Visit: Payer: Self-pay

## 2021-06-02 VITALS — BP 112/70 | HR 78 | Ht 62.0 in | Wt 127.0 lb

## 2021-06-02 DIAGNOSIS — M9904 Segmental and somatic dysfunction of sacral region: Secondary | ICD-10-CM

## 2021-06-02 DIAGNOSIS — M9908 Segmental and somatic dysfunction of rib cage: Secondary | ICD-10-CM

## 2021-06-02 DIAGNOSIS — M9903 Segmental and somatic dysfunction of lumbar region: Secondary | ICD-10-CM

## 2021-06-02 DIAGNOSIS — M9902 Segmental and somatic dysfunction of thoracic region: Secondary | ICD-10-CM

## 2021-06-02 DIAGNOSIS — M9905 Segmental and somatic dysfunction of pelvic region: Secondary | ICD-10-CM

## 2021-06-02 DIAGNOSIS — M533 Sacrococcygeal disorders, not elsewhere classified: Secondary | ICD-10-CM | POA: Diagnosis not present

## 2021-06-02 NOTE — Patient Instructions (Signed)
Good to see you  °4 week follow up for repeat OMT °

## 2021-06-12 ENCOUNTER — Other Ambulatory Visit: Payer: Self-pay

## 2021-06-12 ENCOUNTER — Ambulatory Visit: Payer: PPO | Attending: Family Medicine | Admitting: Physical Therapy

## 2021-06-12 DIAGNOSIS — R252 Cramp and spasm: Secondary | ICD-10-CM | POA: Insufficient documentation

## 2021-06-12 DIAGNOSIS — M6281 Muscle weakness (generalized): Secondary | ICD-10-CM | POA: Diagnosis present

## 2021-06-12 NOTE — Therapy (Signed)
[facility] @ [place], [place], [address] Phone: [phone]   Fax:  [phone]  Physical Therapy Treatment  Patient Details  Name: Kayla [name] MRN: [id number] Date of Birth: January 30, 1951 Referring Provider (PT): [name], DO   Encounter Date: 06/12/2021   PT End of Session - 06/12/21 1230     Visit Number 13    Date for PT Re-Evaluation 06/20/21    Authorization Type health team    PT Start Time 2951    PT Stop Time 1227    PT Time Calculation (min) 42 min    Activity Tolerance Patient tolerated treatment well    Behavior During Therapy [facility] for tasks assessed/performed             No past medical history on file.  Past Surgical History:  Procedure Laterality Date   APPENDECTOMY     age 70   FOOT SURGERY Bilateral    TONSILLECTOMY     age 70    There were no vitals filed for this visit.   Subjective Assessment - 06/12/21 1148     Subjective Pt reports she is now able to have intercourse with just lidocane lubricant without pain. Pt does use dilators once every 3 days for muscle relaxation which really helps. Pt does still have 3/10 coccyx pain on average still only using cushion when sitting pain does increase to 4/10 with sitting on a harder surface.    How long can you sit comfortably? 1.5 without cushion on softer chair.    How long can you stand comfortably? no limits    How long can you walk comfortably? no limits    Currently in Pain? Yes    Pain Score 3     Pain Location Coccyx    Pain Orientation Left    Pain Descriptors / Indicators Burning    Pain Type Chronic pain                               [facility] Adult PT Treatment/Exercise - 06/12/21 0001       Exercises   Exercises Lumbar;Knee/Hip      Lumbar Exercises: Stretches   Active Hamstring Stretch Right;Left;2 reps;30 seconds    Hip Flexor Stretch Right;Left;1 rep;30 seconds    Pelvic  Tilt --    Piriformis Stretch Right;Left;1 rep;30 seconds    Other Lumbar Stretch Exercise hip hinge stretch 2x30s with toes inward    Other Lumbar Stretch Exercise hip IR rotation stretch bil x30s each; hip shifts neutral toes and inward toes x30s each      Manual Therapy   Manual Therapy Soft tissue mobilization    Manual therapy comments coccyx mobilization externally in modified quad stance with anterior/posterior rocking and tail wags with overpressure at coccyx for active mobilization. Pt reported stretching felt at Lt side of coccyx with 1-2/10 pain ubt improved to 0/10 pain post treatment                     PT Education - 06/12/21 1229     Education Details Pt educated on updated HEP    Person(s) Educated Patient    Methods Explanation;Demonstration;Tactile cues;Verbal cues    Comprehension Returned demonstration;Verbalized understanding              PT Short Term Goals - 04/24/21 1712       PT SHORT  TERM GOAL #1   Title Pt to be I with HEP    Time 5    Period Weeks    Status On-going    Target Date 03/25/21      PT SHORT TERM GOAL #2   Title pt to report no more than6/10 pain at coccyx with sitting to increase tolerance to 30 mins without ice pack    Time 5    Period Weeks    Status Achieved    Target Date 03/25/21      PT SHORT TERM GOAL #3   Title pt to demonstrate improved bil hip pain to at least 4/5 globally for improved functional mobility and decreased compensation    Time 5    Period Weeks    Status On-going    Target Date 03/25/21      PT SHORT TERM GOAL #4   Title pt to tolerate vaginal dilator size 4 or equivalent for improved tolerance to vaginal penetration    Time 5    Period Weeks    Status On-going    Target Date 03/25/21               PT Long Term Goals - 02/18/21 1711       PT LONG TERM GOAL #1   Baseline pt to be I with advanced HEP    Time 4    Period Months    Status New    Target Date 06/20/21      PT  LONG TERM GOAL #2   Title pt to tolerate vaginal dilator size 6 or equivalent for improved tolerance to vaginal penetration    Time 4    Period Months    Status New    Target Date 06/20/21      PT LONG TERM GOAL #3   Title pt to be I with self correction of proper breathing mechanics and core/pelvic floor contract/relaxation with mobility to decrease compensatory strategies    Time 4    Period Months    Status New    Target Date 06/20/21      PT LONG TERM GOAL #4   Title pt to demonstrate improved bil hip pain to at least 455 globally for improved functional mobility and decreased compensation    Time 4    Period Months    Status New    Target Date 06/20/21      PT LONG TERM GOAL #5   Title pt to report no more than 2/10 pain at coccyx with sitting to increase tolerance to 30 mins without ice pack    Time 4    Period Months    Status New    Target Date 06/20/21                   Plan - 06/12/21 1230     Clinical Impression Statement Pt reports she is very pleased with progress with PT so far, seeing great improvements in all deficits and pain with now able to have intercourse with only lidocaine lubricant without pain and did not need to use dilators prior to intercourse. Pt is completing dilator use every 3 days with largest dilator for improved tissue mobility and decreased tightness and sometimes pt reports a 4/10 pain with a small area bil at deepest penetration of dilator with sligtht tilt Rt and Lt but releases with overpress for at least 60s per pt and then 0/10 pain. Pt reports she is also having less pain at  coccyx will intermittently do self internal massage at Lt side of coccyx rectally but is not doing this often unless pain increases, on average pt reported 3/10 pain at coccyx but with increased time in sitting and intermittently no longer using cushion for sitting. Pt session focused on exercises and modifications for positions of comfort for pt and updated HEP  for hip and pelvic stretching. Pt tolerated well and denied additional questions at end of session. Pt would benefit from additional PT to address pelvic pain needs.    Personal Factors and Comorbidities Time since onset of injury/illness/exacerbation;Comorbidity 2;Fitness    Comorbidities x2 vaginal births, tearing with one, and history of injury to coccyx with falling on it ~2 years ago    Examination-Activity Limitations Sit;Sleep    Examination-Participation Restrictions Interpersonal Relationship;Yard Work;Community Activity;Driving;Occupation    Stability/Clinical Decision Making Evolving/Moderate complexity    Rehab Potential Good    PT Frequency 2x / week    PT Duration Other (comment)   10 weeks   PT Treatment/Interventions ADLs/Self Care Home Management;Aquatic Therapy;Functional mobility training;Therapeutic exercise;Traction;Neuromuscular re-education;Manual techniques;Patient/family education;Scar mobilization;Passive range of motion;Dry needling;Energy conservation;Visual/perceptual remediation/compensation;Vasopneumatic Device;Taping;Spinal Manipulations;Joint Manipulations    PT Next Visit Plan manual external work and stretching    PT Home Exercise Plan Access Code: [email or web address]    Consulted and Agree with Plan of Care Patient             Patient will benefit from skilled therapeutic intervention in order to improve the following deficits and impairments:  Decreased coordination, Increased fascial restricitons, Decreased endurance, Pain, Postural dysfunction, Improper body mechanics, Impaired flexibility, Decreased mobility, Decreased strength  Visit Diagnosis: Muscle weakness (generalized)  Cramp and spasm     Problem List Patient Active Problem List   Diagnosis Date Noted   Somatic dysfunction of spine, sacral 02/12/2021   Right leg swelling 05/02/2019   Coccyx pain 01/10/2019   Onychomycosis 01/10/2019   Palpitations 02/03/2017   Factor V Leiden ([place]) 02/03/2017    Left bundle branch block (LBBB) 02/03/2017   Vaginal atrophy 12/03/2016   Hemispheric retinal vein occlusion of left eye 02/04/2015   Symptomatic menopausal or female climacteric states 03/10/2014    Stacy Gardner, PT, DPT 06/13/2211:35 PM   [facility] @ [place], [place], [address] Phone: [phone]   Fax:  [phone]  Name: Koren Plyler Lish MRN: [id number] Date of Birth: [birth year]

## 2021-06-19 ENCOUNTER — Encounter: Payer: PPO | Admitting: Physical Therapy

## 2021-06-26 ENCOUNTER — Other Ambulatory Visit: Payer: Self-pay

## 2021-06-26 ENCOUNTER — Ambulatory Visit: Payer: PPO | Attending: Family Medicine | Admitting: Physical Therapy

## 2021-06-26 DIAGNOSIS — R252 Cramp and spasm: Secondary | ICD-10-CM | POA: Diagnosis present

## 2021-06-26 DIAGNOSIS — M6281 Muscle weakness (generalized): Secondary | ICD-10-CM | POA: Insufficient documentation

## 2021-06-26 NOTE — Therapy (Signed)
[facility] @ [place], [place], [address] Phone: [phone]   Fax:  [phone]  Physical Therapy Treatment  Patient Details  Name: Kayla [name] MRN: [id number] Date of Birth: 17-Dec-1950 Referring Provider (PT): [name], DO   Encounter Date: 06/26/2021   PT End of Session - 06/26/21 1720     Visit Number 14    Date for PT Re-Evaluation 09/24/21    Authorization Type health team    PT Start Time 2080    PT Stop Time 2233   pt denied additional needs for PT at this time   PT Time Calculation (min) 39 min    Activity Tolerance Patient tolerated treatment well    Behavior During Therapy [facility] for tasks assessed/performed             No past medical history on file.  Past Surgical History:  Procedure Laterality Date   APPENDECTOMY     age 89   FOOT SURGERY Bilateral    TONSILLECTOMY     age 65    There were no vitals filed for this visit.   Subjective Assessment - 06/26/21 1633     Subjective Pt reports greatly improved pain with intercourse and at coccyx, now able to sit without cushion or ice for extended periods of time.    How long can you sit comfortably? over 2 hours no cushion or ice    How long can you stand comfortably? no limits    How long can you walk comfortably? no limits    Patient Stated Goals to have less pain    Currently in Pain? No/denies                [facility] PT Assessment - 06/26/21 0001       Assessment   Medical Diagnosis M53.3 (ICD-10-CM) - Coccyx pain  N81.89 (ICD-10-CM) - Weakness of pelvic floor    Referring Provider (PT) [name], DO    Onset Date/Surgical Date --   2 years   Prior Therapy previous PT for Lt knee and back pain                           [facility] Adult PT Treatment/Exercise - 06/26/21 0001       Self-Care   Self-Care Other Self-Care Comments    Other Self-Care Comments  updated HEP went over and pt's  questions about stretching technique all went over and demonstated improved technique. Pt also eudcated on weaning down internal home treatment to self monitor symptoms with longer period of time without dilator use. pt verbalized understanding      Exercises   Exercises Lumbar;Knee/Hip      Lumbar Exercises: Stretches   Hip Flexor Stretch Right;Left;1 rep;30 seconds    Piriformis Stretch Right;Left;1 rep;30 seconds    Other Lumbar Stretch Exercise hip hinge stretch 2x30s with toes inward    Other Lumbar Stretch Exercise hip IR rotation stretch bil x30s each; standing tail wags, standing ant/post rocking at tall mat position, and modified standing cat/cow at table all x10 each with 3-5s hold                     PT Education - 06/26/21 1719     Education Details Pt educated on HEP and modifications for knee pain to continue in standing and HEP updated for this and decreasing frequency of dilator use and  self monitoring for pain or discomfort without use of dialtors    Person(s) Educated Patient    Methods Explanation;Demonstration;Tactile cues;Verbal cues    Comprehension Returned demonstration;Verbalized understanding              PT Short Term Goals - 06/26/21 1725       PT SHORT TERM GOAL #1   Title Pt to be I with HEP    Time 5    Period Weeks    Status Achieved    Target Date 03/25/21      PT SHORT TERM GOAL #2   Title pt to report no more than6/10 pain at coccyx with sitting to increase tolerance to 30 mins without ice pack    Time 5    Period Weeks    Status Achieved    Target Date 03/25/21      PT SHORT TERM GOAL #3   Title pt to demonstrate improved bil hip pain to at least 4/5 globally for improved functional mobility and decreased compensation    Time 5    Period Weeks    Status Achieved    Target Date 03/25/21      PT SHORT TERM GOAL #4   Title pt to tolerate vaginal dilator size 4 or equivalent for improved tolerance to vaginal penetration     Time 5    Period Weeks    Status Achieved    Target Date 03/25/21               PT Long Term Goals - 06/26/21 1724       PT LONG TERM GOAL #1   Baseline pt to be I with advanced HEP    Time 4    Period Months    Status Achieved    Target Date 06/20/21      PT LONG TERM GOAL #2   Title pt to tolerate vaginal dilator size 6 or equivalent for improved tolerance to vaginal penetration    Time 4    Period Months    Status Achieved    Target Date 06/20/21      PT LONG TERM GOAL #3   Title pt to be I with self correction of proper breathing mechanics and core/pelvic floor contract/relaxation with mobility to decrease compensatory strategies    Time 4    Period Months    Status Partially Met    Target Date 06/20/21      PT LONG TERM GOAL #4   Title pt to demonstrate improved bil hip pain to at least 455 globally for improved functional mobility and decreased compensation    Time 4    Period Months    Status Partially Met    Target Date 06/20/21      PT LONG TERM GOAL #5   Title pt to report no more than 2/10 pain at coccyx with sitting to increase tolerance to 30 mins without ice pack    Time 4    Period Months    Status Achieved    Target Date 06/20/21                   Plan - 06/26/21 1721     Clinical Impression Statement Pt reports continued improvement and now no pain with intercourse or in sitting and very pleased. Pt session focused on education on weaning down gradually from dilator use to see if she remains pain free, now also not using lidocaine either. Pt reports no pain with  largest dilator use now either or with rectal internal which she does intermittently if coccyx pain begins. Pt agreed and understood how to decrease frequency and self monitor for pain. Pt requests to have one final session in a couple months to assess if pain returns then be DC'd. Pt session focused on stretching and education. Pt would benefit from additional PT to address  pelvic pain needs.    Personal Factors and Comorbidities Time since onset of injury/illness/exacerbation;Comorbidity 2;Fitness    Comorbidities x2 vaginal births, tearing with one, and history of injury to coccyx with falling on it ~2 years ago    Examination-Activity Limitations Sit;Sleep    Examination-Participation Restrictions Interpersonal Relationship;Yard Work;Community Activity;Driving;Occupation    Stability/Clinical Decision Making Evolving/Moderate complexity    Rehab Potential Good    PT Frequency 2x / week    PT Duration Other (comment)   10 weeks   PT Treatment/Interventions ADLs/Self Care Home Management;Aquatic Therapy;Functional mobility training;Therapeutic exercise;Traction;Neuromuscular re-education;Manual techniques;Patient/family education;Scar mobilization;Passive range of motion;Dry needling;Energy conservation;Visual/perceptual remediation/compensation;Vasopneumatic Device;Taping;Spinal Manipulations;Joint Manipulations    PT Next Visit Plan manual external work and stretching    PT Home Exercise Plan Access Code: [email or web address]    Consulted and Agree with Plan of Care Patient             Patient will benefit from skilled therapeutic intervention in order to improve the following deficits and impairments:  Decreased coordination, Increased fascial restricitons, Decreased endurance, Pain, Postural dysfunction, Improper body mechanics, Impaired flexibility, Decreased mobility, Decreased strength  Visit Diagnosis: Muscle weakness (generalized) - Plan: PT plan of care cert/re-cert  Cramp and spasm - Plan: PT plan of care cert/re-cert     Problem List Patient Active Problem List   Diagnosis Date Noted   Somatic dysfunction of spine, sacral 02/12/2021   Right leg swelling 05/02/2019   Coccyx pain 01/10/2019   Onychomycosis 01/10/2019   Palpitations 02/03/2017   Factor V Leiden ([place]) 02/03/2017   Left bundle branch block (LBBB) 02/03/2017   Vaginal atrophy  12/03/2016   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

## 2021-06-27 NOTE — Progress Notes (Signed)
Kayla [name] D.[place] Phone: [phone]   Assessment and Plan:     1. Coccyx pain 2. Somatic dysfunction of lumbar region 3. Somatic dysfunction of pelvic region 4. Somatic dysfunction of sacral region -Chronic with exacerbation, subsequent visit - Patient was doing significantly better, and was nearly pain-free until last week when she had a significant acute flare of low back pain, sacral pain - Recommend using Tylenol for daily pain, relative rest, heating pads.  Patient has a back brace on today's visit.  Advised her that she can use this back brace acutely for the next few days if it provides pain relief, however I do not recommend long-term use of back brace was - Patient has received significant relief with OMT in the past.  Elects for repeat OMT today.  Tolerated well per note below. - Decision today to treat with OMT was based on Physical Exam   After verbal consent patient was treated with ME (muscle energy), FPR (flex positional release), ST (soft tissue), PC/PD (Pelvic Compression/ Pelvic Decompression) techniques in sacral, lumbar, and pelvic areas. Patient tolerated the procedure well with improvement in symptoms.  Patient educated on potential side effects of soreness and recommended to rest, hydrate, and use Tylenol as needed for pain control.   Pertinent previous records reviewed include none   Follow Up: 4 weeks for  OMT maintenance.  Patient may call and come back sooner if acute flare does not wear off   Subjective:   I, Kayla [name], am serving as a Education administrator for [name]  Chief Complaint: coccyx pain   HPI:  05/08/21 Patient is a 71 year old female presenting with coccyx pain. Patient was last seen by Dr. [name] on 03/26/21 for this reason and had OMT. Today patient states that she has been doing better since going to the pelvic floor therapist but her lower back has been worse since  doing the exercises the therapist gave her to do.    06/02/2021  Patient states that she is doing really well     06/30/2021 Patient states that she threw her back out on Friday thinks it could have been when she was walking to the park with her husband had to a change of terrain. Coccyx is feeling much better    Relevant Historical Information: LBBB, factor V Leiden   Additional pertinent review of systems negative.  Current Outpatient Medications  Medication Sig Dispense Refill   Ascorbic Acid (VITAMIN C PO) Take 1 capsule by mouth daily.     aspirin 81 MG tablet Take 81 mg by mouth daily.     B Complex Vitamins (VITAMIN-B COMPLEX) TABS Take 1 tablet by mouth daily.     Docusate Calcium (STOOL SOFTENER PO) Take 1 capsule by mouth daily.     fish oil-omega-3 fatty acids 1000 MG capsule Take 2 g by mouth daily.     MULTIPLE VITAMIN PO Take 1 tablet by mouth daily.     PARoxetine (PAXIL) 10 MG tablet Take 10 mg by mouth daily.     Polyethylene Glycol POWD 1 g by Does not apply route daily.     gabapentin (NEURONTIN) 100 MG capsule Take 2 capsules (200 mg total) by mouth at bedtime. (Patient not taking: Reported on 06/30/2021) 180 capsule 0   ibuprofen (ADVIL,MOTRIN) 200 MG tablet Take 400 mg by mouth as needed for pain. (Patient not taking: Reported on 06/30/2021)  No current facility-administered medications for this visit.      Objective:     Vitals:   06/30/21 1503  BP: 120/80  Pulse: 74  SpO2: 97%  Weight: 131 lb (59.4 kg)  Height: 5\' 2"  (1.575 m)      Body mass index is 23.96 kg/m.    Physical Exam:     General: Well-appearing, cooperative, sitting comfortably in no acute distress.   OMT Physical Exam:  ASIS Compression Test: Positive Right Sacrum: Positive sphinx.  TTP bilateral sacral base, worse on right Lumbar: TTP paraspinal, with muscle spasming along right paraspinal muscles.  L2-4 RRSL Pelvis: Right anterior innominate  Electronically signed by:  Kayla [name] D.[name] 3:29 PM 06/30/21

## 2021-06-30 ENCOUNTER — Other Ambulatory Visit: Payer: Self-pay

## 2021-06-30 ENCOUNTER — Ambulatory Visit: Payer: PPO | Admitting: Sports Medicine

## 2021-06-30 VITALS — BP 120/80 | HR 74 | Ht 62.0 in | Wt 131.0 lb

## 2021-06-30 DIAGNOSIS — M9905 Segmental and somatic dysfunction of pelvic region: Secondary | ICD-10-CM | POA: Diagnosis not present

## 2021-06-30 DIAGNOSIS — M9903 Segmental and somatic dysfunction of lumbar region: Secondary | ICD-10-CM

## 2021-06-30 DIAGNOSIS — M9904 Segmental and somatic dysfunction of sacral region: Secondary | ICD-10-CM

## 2021-06-30 DIAGNOSIS — M533 Sacrococcygeal disorders, not elsewhere classified: Secondary | ICD-10-CM

## 2021-06-30 NOTE — Patient Instructions (Addendum)
Good to see you  Tylenol 4 times daily , heating pads , and relative rest  4 week follow up for repeat OMT can call back sooner if needed

## 2021-07-23 NOTE — Progress Notes (Signed)
[name] D.[place] Phone: [phone]   Assessment and Plan:     1. Chronic bilateral low back pain without sciatica 2. Lumbar facet arthropathy 3. Chronic right sacroiliac joint pain 4. Coccyx pain -Chronic with exacerbation, subsequent visit - Recurrence of multiple musculoskeletal complaints including severe low back pain limiting day-to-day activities, severe right sacroiliac pain, moderate coccyx pain, pain radiating into higher lumbar regions - Due to severity of patient's pain, failure to improve with conservative therapy for >6 weeks, facet arthropathies seen on lumbar x-ray, we will further evaluate lumbar spine with lumbar MRI -use Tylenol [phone] mg 2- 3 times a day for day-to-day pain - MR Lumbar Spine Wo Contrast; Future  - Patient has received significant relief with OMT in the past.  Elects for repeat OMT today.  Tolerated well per note below. - Decision today to treat with OMT was based on Physical Exam   After verbal consent patient was treated with HVLA (high velocity low amplitude), ME (muscle energy), FPR (flex positional release), ST (soft tissue), PC/PD (Pelvic Compression/ Pelvic Decompression) techniques in  thoracic, lumbar, and pelvic areas. Patient tolerated the procedure well with improvement in symptoms.  Patient educated on potential side effects of soreness and recommended to rest, hydrate, and use Tylenol as needed for pain control.   Pertinent previous records reviewed include lumbar x-ray 02/12/2021, MRI pelvis 01/17/2021   Follow Up: After MRI to review results.  Based on results could consider continuing with conservative therapy of medication and OMT versus introducing new PT or epidural/facet injections.  Of note, patient is hesitant to have injections into her back because of a bad experience of pain and no relief with a knee injection in the past   Subjective:   I, Mitchell,  am serving as a Education administrator for [name]  Chief Complaint: coccyx pain   HPI:  05/08/21 Patient is a 71 year old female presenting with coccyx pain. Patient was last seen by Dr. [name] on 03/26/21 for this reason and had OMT. Today patient states that she has been doing better since going to the pelvic floor therapist but her lower back has been worse since doing the exercises the therapist gave her to do.    06/02/2021  Patient states that she is doing really well     06/30/2021 Patient states that she threw her back out on Friday thinks it could have been when she was walking to the park with her husband had to a change of terrain. 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  Current Outpatient Medications  Medication Sig Dispense Refill   Ascorbic Acid (VITAMIN C PO) Take 1 capsule by mouth daily.     aspirin 81 MG tablet Take 81 mg by mouth daily.     B Complex Vitamins (VITAMIN-B COMPLEX) TABS Take 1 tablet by mouth daily.     Docusate Calcium (STOOL SOFTENER PO) Take 1 capsule by mouth daily.     fish oil-omega-3 fatty acids 1000 MG capsule Take 2 g by mouth daily.     gabapentin (NEURONTIN) 100 MG capsule Take 2 capsules (200 mg total)  by mouth at bedtime. 180 capsule 0   ibuprofen (ADVIL,MOTRIN) 200 MG tablet Take 400 mg by mouth as needed for pain.     MULTIPLE VITAMIN PO Take 1 tablet by mouth daily.     PARoxetine (PAXIL) 10 MG tablet Take 10 mg by mouth daily.     Polyethylene Glycol POWD 1 g by Does not apply route daily.     No current facility-administered medications for this visit.      Objective:     Vitals:   07/24/21 1510  BP: 110/72  Pulse: 73  SpO2: 96%  Weight:  130 lb (59 kg)  Height: 5\' 2"  (1.575 m)      Body mass index is 23.78 kg/m.    Physical Exam:      OMT Physical Exam:  ASIS Compression Test: Positive Right Sacrum: Positive sphinx.  Bilaterally tender sacral base, worse on right compared to left Lumbar: TTP paraspinal, L1-3 RRSL Pelvis: Right anterior innominate  Gen: Appears well, nad, nontoxic and pleasant Psych: Alert and oriented, appropriate mood and affect Neuro: sensation intact, strength is 5/5 in upper and lower extremities, muscle tone wnl Skin: no susupicious lesions or rashes  Back - Normal skin, Spine with normal alignment and no deformity.   No tenderness to vertebral process palpation.   Lumbar paraspinous muscles are bilaterally tender and without spasm Straight leg raise negative    Electronically signed by:  [name] D.[name] 3:52 PM 07/24/21

## 2021-07-24 ENCOUNTER — Ambulatory Visit: Payer: PPO | Admitting: Sports Medicine

## 2021-07-24 ENCOUNTER — Other Ambulatory Visit: Payer: Self-pay

## 2021-07-24 VITALS — BP 110/72 | HR 73 | Ht 62.0 in | Wt 130.0 lb

## 2021-07-24 DIAGNOSIS — M533 Sacrococcygeal disorders, not elsewhere classified: Secondary | ICD-10-CM | POA: Diagnosis not present

## 2021-07-24 DIAGNOSIS — M545 Low back pain, unspecified: Secondary | ICD-10-CM

## 2021-07-24 DIAGNOSIS — G8929 Other chronic pain: Secondary | ICD-10-CM

## 2021-07-24 DIAGNOSIS — M47816 Spondylosis without myelopathy or radiculopathy, lumbar region: Secondary | ICD-10-CM | POA: Diagnosis not present

## 2021-07-24 DIAGNOSIS — M9903 Segmental and somatic dysfunction of lumbar region: Secondary | ICD-10-CM | POA: Diagnosis not present

## 2021-07-24 DIAGNOSIS — M9904 Segmental and somatic dysfunction of sacral region: Secondary | ICD-10-CM

## 2021-07-24 DIAGNOSIS — M9905 Segmental and somatic dysfunction of pelvic region: Secondary | ICD-10-CM

## 2021-07-24 NOTE — Patient Instructions (Addendum)
Good to see you  MRI referral  [phone] mg tylenol 2-3 times a day for day to day pain  Continue heating pad  3 days after MRI call our clinic to make appointment to can discuss results

## 2021-08-04 ENCOUNTER — Telehealth: Payer: Self-pay | Admitting: Sports Medicine

## 2021-08-04 NOTE — Telephone Encounter (Signed)
FYI from pt MRI scheduled for 08/08/2021.

## 2021-08-08 ENCOUNTER — Ambulatory Visit
Admission: RE | Admit: 2021-08-08 | Discharge: 2021-08-08 | Disposition: A | Discharge status: Home / Self Care | Payer: PPO | Source: Ambulatory Visit | Attending: Sports Medicine | Admitting: Sports Medicine

## 2021-08-08 ENCOUNTER — Other Ambulatory Visit: Payer: Self-pay

## 2021-08-08 DIAGNOSIS — M47816 Spondylosis without myelopathy or radiculopathy, lumbar region: Secondary | ICD-10-CM

## 2021-08-08 DIAGNOSIS — M545 Low back pain, unspecified: Secondary | ICD-10-CM

## 2021-08-08 DIAGNOSIS — G8929 Other chronic pain: Secondary | ICD-10-CM

## 2021-08-08 DIAGNOSIS — M533 Sacrococcygeal disorders, not elsewhere classified: Secondary | ICD-10-CM

## 2021-08-11 ENCOUNTER — Encounter: Payer: Self-pay | Admitting: Sports Medicine

## 2021-08-12 NOTE — Telephone Encounter (Signed)
Pt was messaged and advised to make an appointment to follow up so that her MRI results could be discussed

## 2021-08-13 ENCOUNTER — Other Ambulatory Visit: Payer: Self-pay

## 2021-08-13 ENCOUNTER — Ambulatory Visit: Payer: PPO | Admitting: Sports Medicine

## 2021-08-13 VITALS — BP 118/80 | HR 77 | Ht 62.0 in | Wt 130.0 lb

## 2021-08-13 DIAGNOSIS — M545 Low back pain, unspecified: Secondary | ICD-10-CM | POA: Diagnosis not present

## 2021-08-13 DIAGNOSIS — M47816 Spondylosis without myelopathy or radiculopathy, lumbar region: Secondary | ICD-10-CM

## 2021-08-13 DIAGNOSIS — M9903 Segmental and somatic dysfunction of lumbar region: Secondary | ICD-10-CM | POA: Diagnosis not present

## 2021-08-13 DIAGNOSIS — M9904 Segmental and somatic dysfunction of sacral region: Secondary | ICD-10-CM

## 2021-08-13 DIAGNOSIS — M533 Sacrococcygeal disorders, not elsewhere classified: Secondary | ICD-10-CM | POA: Diagnosis not present

## 2021-08-13 DIAGNOSIS — G8929 Other chronic pain: Secondary | ICD-10-CM | POA: Diagnosis not present

## 2021-08-13 DIAGNOSIS — M9905 Segmental and somatic dysfunction of pelvic region: Secondary | ICD-10-CM

## 2021-08-13 NOTE — Patient Instructions (Addendum)
Good to see you  Facet injection referral  Call our clinic 2 weeks after your injection to review your results and repeat OMT

## 2021-08-13 NOTE — Progress Notes (Signed)
Kayla [name] D.Kayla [name] [place] Phone: [phone]   Assessment and Plan:     1. Chronic bilateral low back pain without sciatica 2. Lumbar facet arthropathy 3. Chronic right sacroiliac joint pain 4. Coccyx pain 5. Somatic dysfunction of lumbar region 6. Somatic dysfunction of pelvic region 7. Somatic dysfunction of sacral region -Chronic with exacerbation, subsequent visit - Recurrence of multiple musculoskeletal complaints with most prominent being in low back, bilateral SI joints and coccyx pain - Reviewed MRI with patient in clinic showing lumbar spondylosis with moderate to advanced facet arthrosis at L4-L5, grade 1 anterior listhesis, similar but less severe findings at L5-S1, mild edema within left L5 pedicle - Patient elects to proceed with L4-L5 left-sided spinal epidural to see if this improves symptoms.  We will follow-up 2 weeks after injection - Continue Tylenol [phone] mg to 3 times a day for day-to-day pain relief - Patient has received significant relief with OMT in the past.  Elects for repeat OMT today.  Tolerated well per note below. - Decision today to treat with OMT was based on Physical Exam  After verbal consent patient was treated with HVLA (high velocity low amplitude), ME (muscle energy), FPR (flex positional release), ST (soft tissue), PC/PD (Pelvic Compression/ Pelvic Decompression) techniques in sacrum, lumbar, and pelvic areas. Patient tolerated the procedure well with improvement in symptoms.  Patient educated on potential side effects of soreness and recommended to rest, hydrate, and use Tylenol as needed for pain control.    Pertinent previous records reviewed include MRI lumbar spine   Follow Up: 2 weeks after lumbar epidural to review effectiveness of injection as well as repeat OMT if needed   Subjective:   I, Kayla [name], am serving as a Education administrator for Doctor Kayla [name]  Chief Complaint: coccyx pain   HPI:  05/08/21 Patient is a 71 year old female presenting with coccyx pain. Patient was last seen by Dr. Tamala [name] on 03/26/21 for this reason and had OMT. Today patient states that she has been doing better since going to the pelvic floor therapist but her lower back has been worse since doing the exercises the therapist gave her to do.    06/02/2021  Patient states that she is doing really well     06/30/2021 Patient states that she threw her back out on Friday thinks it could have been when she was walking to the park with her husband had to a change of terrain. 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   Current Outpatient Medications:    Ascorbic Acid (VITAMIN C PO), Take 1 capsule by mouth daily., Disp: , Rfl:    aspirin 81 MG tablet, Take 81 mg by mouth daily., Disp: , Rfl:    B Complex Vitamins (VITAMIN-B COMPLEX) TABS, Take 1 tablet by mouth daily., Disp: , Rfl:    Docusate Calcium (STOOL SOFTENER PO), Take 1 capsule by mouth daily., Disp: , Rfl:    fish oil-omega-3 fatty acids 1000 MG capsule,  Take 2 g by mouth daily., Disp: , Rfl:    fluticasone (FLONASE) 50 MCG/ACT nasal spray, Place into both nostrils daily., Disp: , Rfl:    gabapentin (NEURONTIN) 100 MG capsule, Take 2 capsules (200 mg total) by mouth at bedtime., Disp: 180 capsule, Rfl: 0   MULTIPLE VITAMIN PO, Take 1 tablet by mouth daily., Disp: , Rfl:    PARoxetine (PAXIL) 10 MG tablet, Take 10 mg by mouth daily., Disp: , Rfl:    Polyethylene Glycol POWD, 1 g by Does not apply route  daily., Disp: , Rfl:    ibuprofen (ADVIL,MOTRIN) 200 MG tablet, Take 400 mg by mouth as needed for pain. (Patient not taking: Reported on 08/13/2021), Disp: , Rfl:    Objective:     Vitals:   08/13/21 1457  BP: 118/80  Pulse: 77  SpO2: 97%  Weight: 130 lb (59 kg)  Height: 5\' 2"  (1.575 m)      Body mass index is 23.78 kg/m.    Physical Exam:    OMT Physical Exam:   ASIS Compression Test: Positive Right Sacrum: Positive sphinx.  Bilaterally tender sacral base, worse on right compared to left Lumbar: TTP paraspinal, L1-3 RRSL Pelvis: Right anterior innominate  Gen: Appears well, nad, nontoxic and pleasant Psych: Alert and oriented, appropriate mood and affect Neuro: sensation intact, strength is 5/5 in upper and lower extremities, muscle tone wnl Skin: no susupicious lesions or rashes   Back - Normal skin, Spine with normal alignment and no deformity.   No tenderness to vertebral process palpation.   Lumbar paraspinous muscles are bilaterally tender and without spasm Straight leg raise negative   Electronically signed by:  Kayla [name] D.[name] 3:35 PM 08/13/21

## 2021-08-14 ENCOUNTER — Other Ambulatory Visit: Payer: Self-pay | Admitting: Sports Medicine

## 2021-08-14 DIAGNOSIS — M9904 Segmental and somatic dysfunction of sacral region: Secondary | ICD-10-CM

## 2021-08-14 DIAGNOSIS — M545 Low back pain, unspecified: Secondary | ICD-10-CM

## 2021-08-14 DIAGNOSIS — M9903 Segmental and somatic dysfunction of lumbar region: Secondary | ICD-10-CM

## 2021-08-14 DIAGNOSIS — G8929 Other chronic pain: Secondary | ICD-10-CM

## 2021-08-21 ENCOUNTER — Other Ambulatory Visit: Payer: Self-pay

## 2021-08-21 ENCOUNTER — Ambulatory Visit
Admission: RE | Admit: 2021-08-21 | Discharge: 2021-08-21 | Disposition: A | Discharge status: Home / Self Care | Payer: PPO | Source: Ambulatory Visit | Attending: Sports Medicine | Admitting: Sports Medicine

## 2021-08-21 DIAGNOSIS — G8929 Other chronic pain: Secondary | ICD-10-CM

## 2021-08-21 DIAGNOSIS — M9904 Segmental and somatic dysfunction of sacral region: Secondary | ICD-10-CM

## 2021-08-21 DIAGNOSIS — M9903 Segmental and somatic dysfunction of lumbar region: Secondary | ICD-10-CM

## 2021-08-21 DIAGNOSIS — M545 Low back pain, unspecified: Secondary | ICD-10-CM

## 2021-08-21 DIAGNOSIS — M533 Sacrococcygeal disorders, not elsewhere classified: Secondary | ICD-10-CM

## 2021-08-21 MED ORDER — METHYLPREDNISOLONE ACETATE 40 MG/ML INJ SUSP (RADIOLOG
80.0000 mg | Freq: Once | INTRAMUSCULAR | Status: AC
  Administered 2021-08-21: 80 mg via EPIDURAL

## 2021-08-21 MED ORDER — IOPAMIDOL (ISOVUE-M 200) INJECTION 41%
1.0000 mL | Freq: Once | INTRAMUSCULAR | Status: AC
Start: 2021-08-21 — End: 2021-08-21
  Administered 2021-08-21: 1 mL via EPIDURAL

## 2021-08-21 NOTE — Discharge Instructions (Signed)

## 2021-09-02 NOTE — Progress Notes (Signed)
? ? [name] D.Merril Abbe ?[facility] ?[place] ?Phone: [phone] ?  ?Assessment and Plan:   ?  ?1. Chronic bilateral low back pain without sciatica ?2. Chronic right sacroiliac joint pain ?3. Somatic dysfunction of sacral region ?4. Somatic dysfunction of rib region ?5. Somatic dysfunction of pelvic region ?6. Somatic dysfunction of thoracic region ?7. Somatic dysfunction of lumbar region ?-Chronic with exacerbation, subsequent visit ?- Continued multiple musculoskeletal complaints with most prominent being in lower thoracic spine radiating to right shoulder blade ?- Overall patient's low back pain has improved since epidural injection 2 weeks ago ?- Patient has received significant relief with OMT in the past.  Elects for repeat OMT today.  Tolerated well per note below. ?- Decision today to treat with OMT was based on Physical Exam ?-Recommended to increase Tylenol use acutely for the next 2 weeks while having increased levels of pain.  Maximum recommended dose Tylenol 1000 mg 3 times a day ? ?After verbal consent patient was treated with HVLA (high velocity low amplitude), ME (muscle energy), FPR (flex positional release), ST (soft tissue), PC/PD (Pelvic Compression/ Pelvic Decompression) techniques in sacrum, rib, thoracic, lumbar, and pelvic areas. Patient tolerated the procedure well with improvement in symptoms.  Patient educated on potential side effects of soreness and recommended to rest, hydrate, and use Tylenol as needed for pain control.  ?  ?Pertinent previous records reviewed include epidural procedure note 08/21/2021 ?  ?Follow Up: 4 weeks for repeat OMT maintenance ?  ?Subjective:   ?I, [name], am serving as a scribe for Dr. [name] ? ?Chief Complaint: epidural follow up  ? ?HPI:  ?05/08/21 ?Patient is a 71 year old female presenting with coccyx pain. Patient was last seen by Dr. [name] on 03/26/21 for this reason and had  OMT. Today patient states that she has been doing better since going to the pelvic floor therapist but her lower back has been worse since doing the exercises the therapist gave her to do.  ?  ?06/02/2021  ?Patient states that she is doing really well   ?  ?06/30/2021 ?Patient states that she threw her back out on Friday thinks it could have been when she was walking to the park with her husband had to a change of terrain. Coccyx is feeling much better  ?  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  ? 08/13/2021 ?Patient states that she is feeling the same knee cap is bothering her and the low back is still the same  ? ?09/03/2021 ?Patient states that she thinks she is improving, but still does have some bad days. The only other thing is she has been getting bad cramps in both feet and is not sure if that has anything to do with her issues.  Patient had epidural 2 weeks ago.  She states that overall she had decreased pain radiating into her legs as well as decreased sacral pain and decreased tailbone pain.  She thinks that overall it was beneficial.  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 ?  ?Relevant Historical Information: LBBB, factor V Leiden ? ?Additional pertinent review of systems negative. ? ? ?Current Outpatient Medications:  ?  Ascorbic Acid (VITAMIN C PO), Take  1 capsule by mouth daily., Disp: , Rfl:  ?  aspirin 81 MG tablet, Take 81 mg by mouth daily., Disp: , Rfl:  ?  B Complex Vitamins (VITAMIN-B COMPLEX) TABS, Take 1 tablet by mouth daily., Disp: , Rfl:  ?  Docusate Calcium (STOOL SOFTENER PO), Take 1 capsule by mouth daily., Disp: , Rfl:  ?  fish oil-omega-3 fatty acids 1000 MG capsule, Take 2 g by mouth daily., Disp: , Rfl:  ?  fluticasone (FLONASE) 50 MCG/ACT nasal spray,  Place into both nostrils daily., Disp: , Rfl:  ?  gabapentin (NEURONTIN) 100 MG capsule, Take 2 capsules (200 mg total) by mouth at bedtime., Disp: 180 capsule, Rfl: 0 ?  MULTIPLE VITAMIN PO, Take 1 tablet by mouth daily., Disp: , Rfl:  ?  PARoxetine (PAXIL) 10 MG tablet, Take 10 mg by mouth daily., Disp: , Rfl:  ?  Polyethylene Glycol POWD, 1 g by Does not apply route daily., Disp: , Rfl:  ?  ibuprofen (ADVIL,MOTRIN) 200 MG tablet, Take 400 mg by mouth as needed for pain. (Patient not taking: Reported on 09/03/2021), Disp: , Rfl:   ? ?Objective:   ?  ?Vitals:  ? 09/03/21 1431  ?BP: 120/80  ?Pulse: 91  ?SpO2: 97%  ?Weight: 130 lb (59 kg)  ?Height: '5\' 2"'$  (1.575 m)  ?  ?  ?Body mass index is 23.78 kg/m?.  ?  ?Physical Exam:   ? ?General: Well-appearing, cooperative, sitting comfortably in no acute distress.  ? ?OMT Physical Exam: ? ?ASIS Compression Test: Positive Right ?Sacrum: Positive sphinx, TTP right sacral base ?Rib:  ribs 5-8 on right in inhalation ?Thoracic: TTP paraspinal, increased kyphosis.  Restricted movement in T5-10 ?Lumbar: TTP paraspinal, L1-3 RRSL ?Pelvis: Right anterior innominate  ? ? ?Electronically signed by:  ?[name] D.Merril Abbe ?[facility] ?3:15 PM 09/03/21 ?

## 2021-09-03 ENCOUNTER — Other Ambulatory Visit: Payer: Self-pay

## 2021-09-03 ENCOUNTER — Ambulatory Visit: Payer: PPO | Admitting: Sports Medicine

## 2021-09-03 VITALS — BP 120/80 | HR 91 | Ht 62.0 in | Wt 130.0 lb

## 2021-09-03 DIAGNOSIS — M9904 Segmental and somatic dysfunction of sacral region: Secondary | ICD-10-CM | POA: Diagnosis not present

## 2021-09-03 DIAGNOSIS — M9903 Segmental and somatic dysfunction of lumbar region: Secondary | ICD-10-CM

## 2021-09-03 DIAGNOSIS — M545 Low back pain, unspecified: Secondary | ICD-10-CM | POA: Diagnosis not present

## 2021-09-03 DIAGNOSIS — M533 Sacrococcygeal disorders, not elsewhere classified: Secondary | ICD-10-CM | POA: Diagnosis not present

## 2021-09-03 DIAGNOSIS — M9905 Segmental and somatic dysfunction of pelvic region: Secondary | ICD-10-CM

## 2021-09-03 DIAGNOSIS — G8929 Other chronic pain: Secondary | ICD-10-CM

## 2021-09-03 DIAGNOSIS — M9902 Segmental and somatic dysfunction of thoracic region: Secondary | ICD-10-CM

## 2021-09-03 DIAGNOSIS — M9908 Segmental and somatic dysfunction of rib cage: Secondary | ICD-10-CM | POA: Diagnosis not present

## 2021-09-03 NOTE — Patient Instructions (Addendum)
Good to see you  ? ?Follow up in 4 weeks for repeat OMT ?

## 2021-09-04 IMAGING — MR MR PELVIS W/O CM
4 of 6 series · 25 of 48 positions shown · non-contrast
Comparison: None.

CLINICAL DATA: Buttock and coccygeal pain for 2 years since the
patient slipped and fell on her buttocks at a pool.

EXAM:
MRI PELVIS WITHOUT CONTRAST
TECHNIQUE: Multiplanar, multisequence MR imaging of the pelvis was performed.
No intravenous contrast was administered.

[Series 3: STIR · coronal · 4.0mm · 0.70mm/px · 7 of 31 slices shown]
[im 1/31]
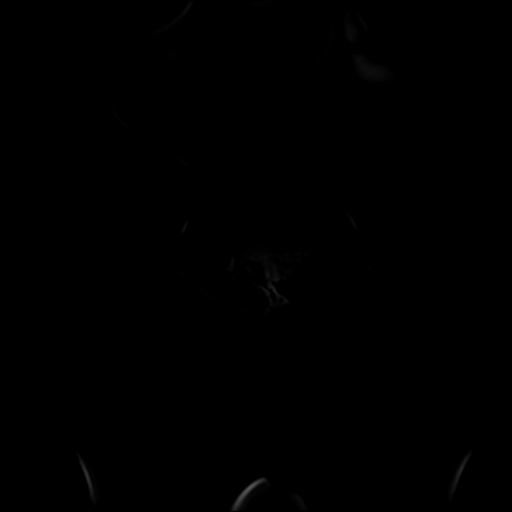
[im 6/31]
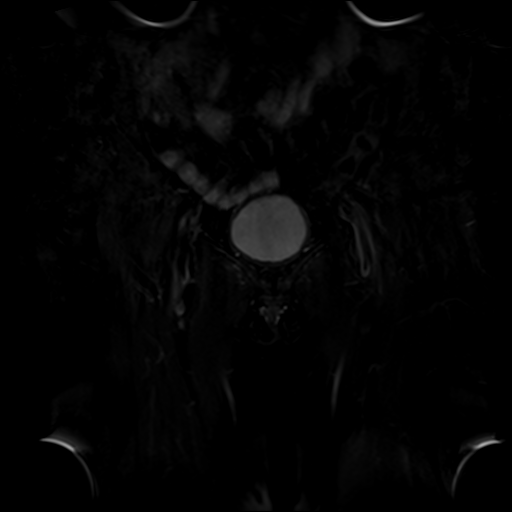
[im 11/31]
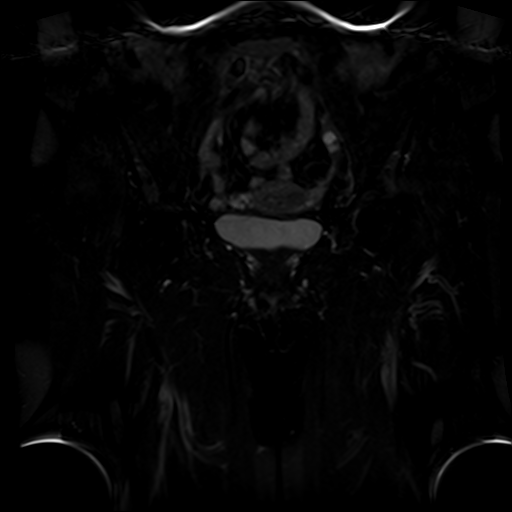
[im 16/31]
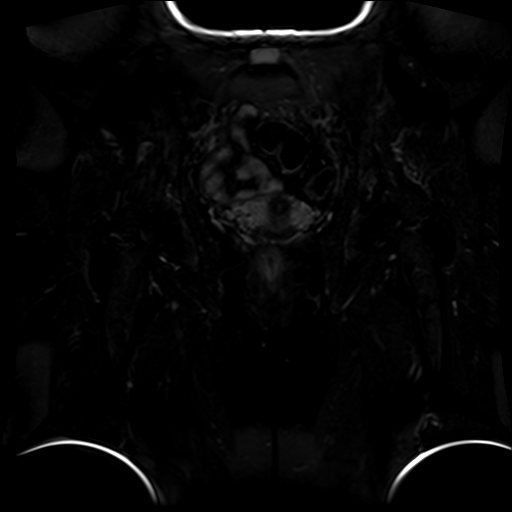
[im 21/31]
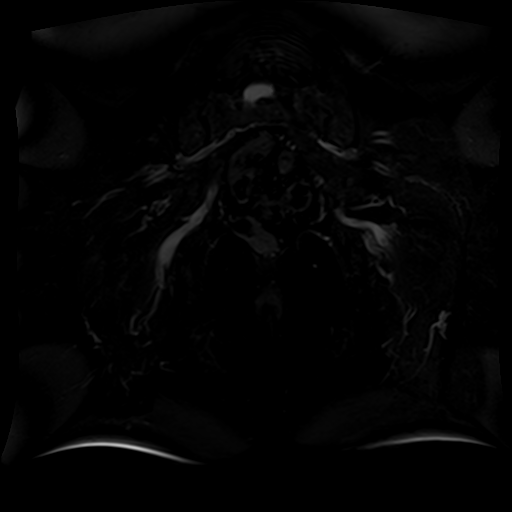
[im 26/31]
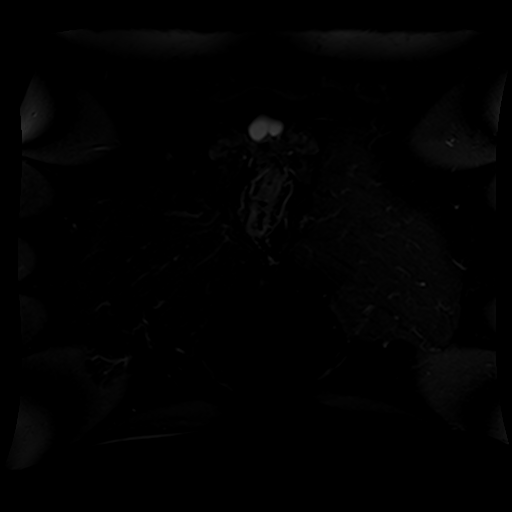
[im 31/31]
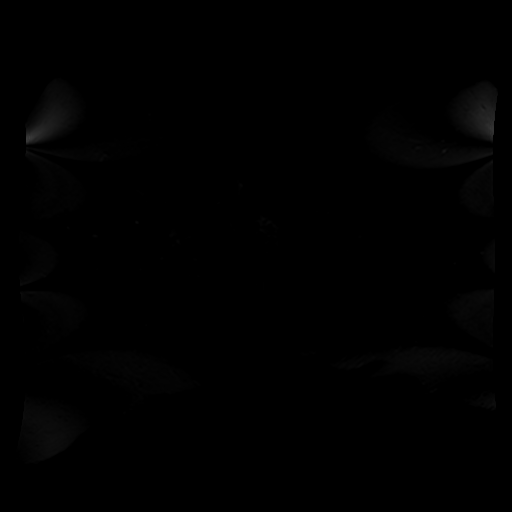

[Series 4: T1 · coronal · 4.0mm · 1.12mm/px · 7 of 31 slices shown (1 of 2)]
[im 1/31]
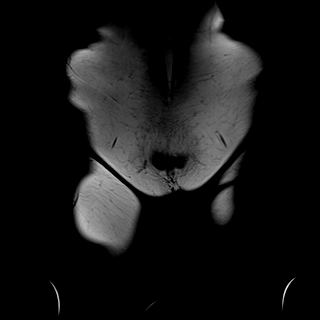
[im 6/31]
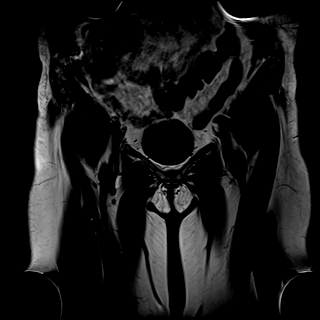
[im 11/31]
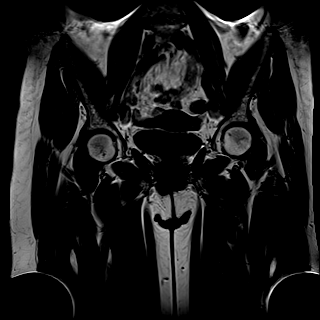
[im 16/31]
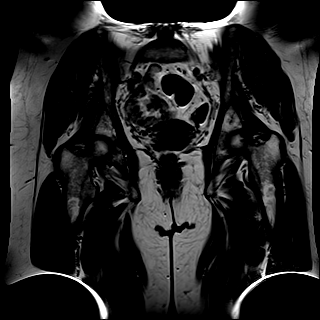
[im 21/31]
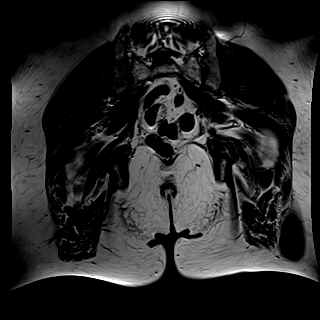
[im 26/31]
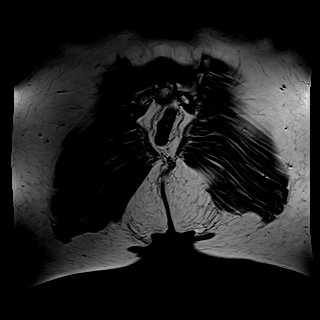
[im 31/31]
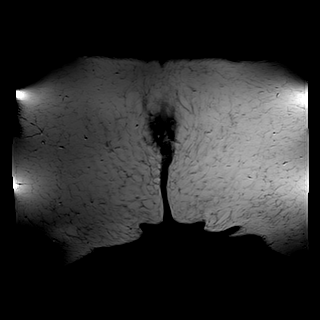

[Series 6: T2 fat-sat · sagittal · 4.0mm · 0.51mm/px · 8 of 53 slices shown]
[im 1/53]
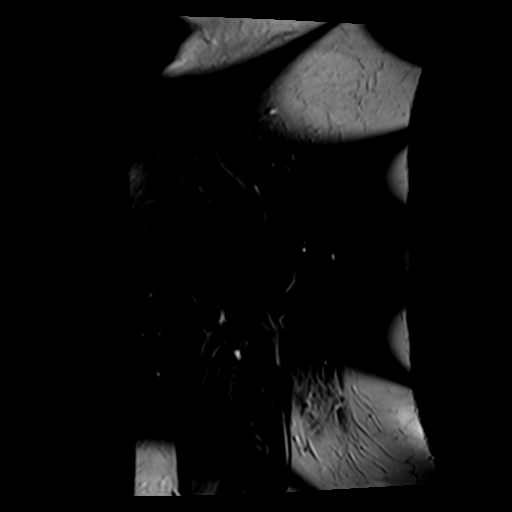
[im 10/53]
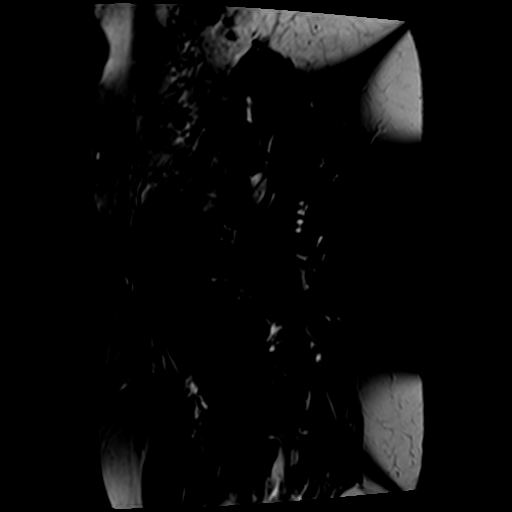
[im 15/53]
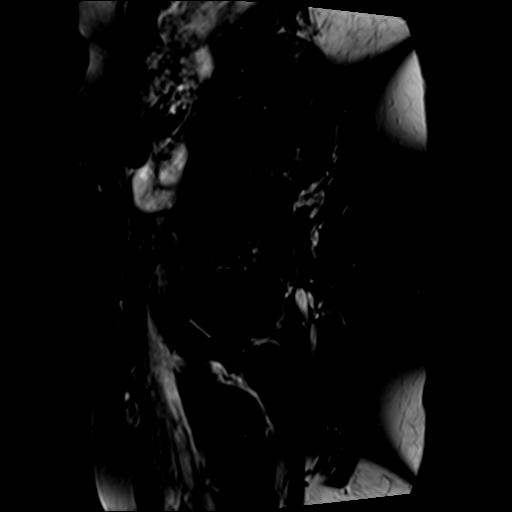
[im 24/53]
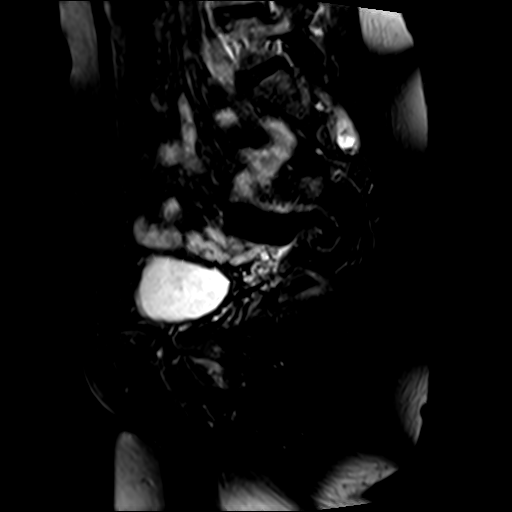
[im 29/53]
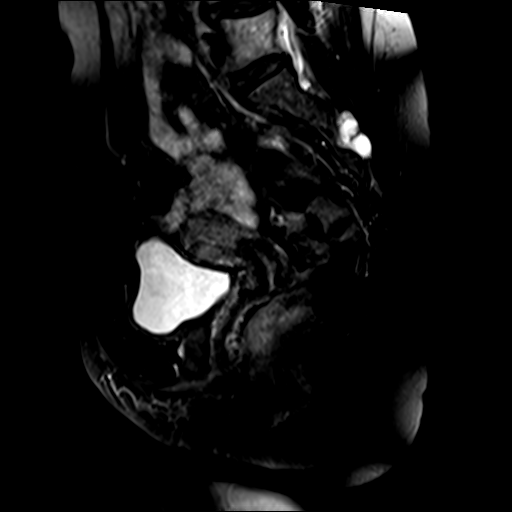
[im 38/53]
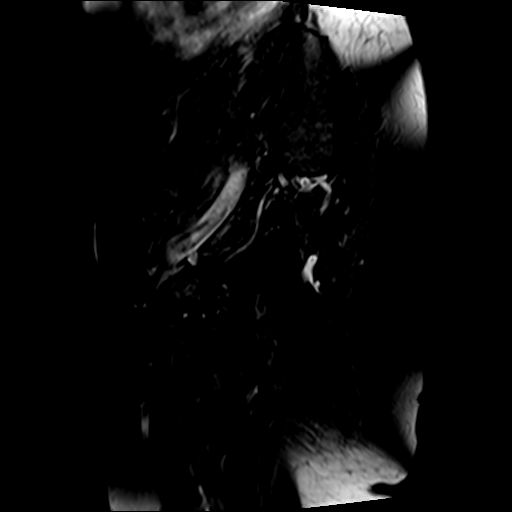
[im 43/53]
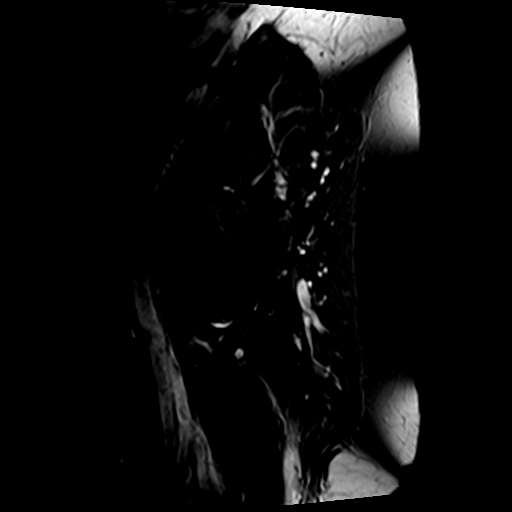
[im 48/53]
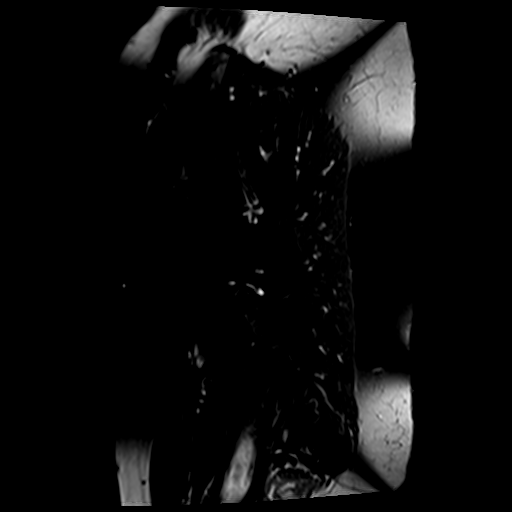

[Series 7: T1 · axial · 4.0mm · 1.00mm/px · z∈[-4,+163]mm · 3 of 43 slices shown (2 of 2)]
[im 6/43]
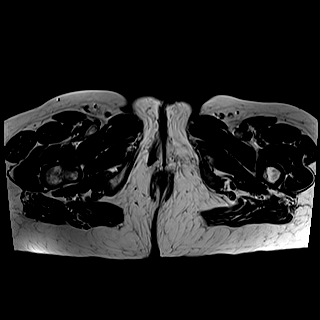
[im 22/43]
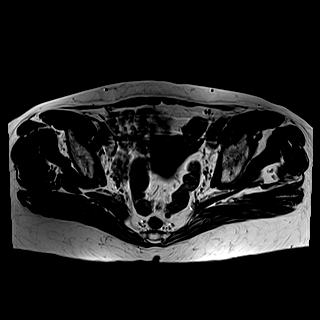
[im 37/43]
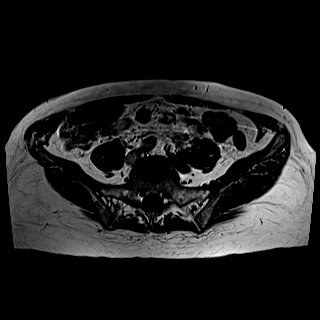

[25 of 48 positions shown; findings below may reference images not displayed]

FINDINGS: Bones/Joint/Cartilage

No fracture, contusion, stress change or worrisome lesion is
identified. There is no subchondral cyst formation or edema about
the hips. No avascular necrosis of the femoral heads. Incidental
note is made of Tarlov cysts in the sacrum. No hip or SI joint
effusion is seen. Hip joint spaces are preserved. Mild to moderate
facet degenerative change is seen in the lower lumbar spine.

Ligaments

Appear normal.

Muscles and Tendons

Intact and normal in appearance.

Soft tissues

Imaged intrapelvic contents are negative.
IMPRESSION: Tarlov cysts are seen in the sacral segments heart doubtful clinical
significance. The pelvis is otherwise negative.

Mild to moderate facet degenerative disease lower lumbar spine.

## 2021-09-25 ENCOUNTER — Ambulatory Visit: Payer: PPO | Attending: Family Medicine | Admitting: Physical Therapy

## 2021-09-25 DIAGNOSIS — M6281 Muscle weakness (generalized): Secondary | ICD-10-CM | POA: Insufficient documentation

## 2021-09-25 DIAGNOSIS — M62838 Other muscle spasm: Secondary | ICD-10-CM | POA: Insufficient documentation

## 2021-09-25 NOTE — Therapy (Addendum)
[facility] ?[place], [place], [address] ?Phone: [phone]   Fax:  [phone] ? ?Physical Therapy Treatment ? ?Patient Details  ?Name: Kayla [name] ?MRN: [id number] ?Date of Birth: 1951-06-19 ?Referring Provider (PT): [name], DO ? ? ?Encounter Date: 09/25/2021 ? ? PT End of Session - 09/25/21 1606   ? ? Visit Number 15   ? Date for PT Re-Evaluation 09/25/21   ? Authorization Type health team   ? PT Start Time [phone]   ? PT Stop Time 1642   ? PT Time Calculation (min) 40 min   ? Activity Tolerance Patient tolerated treatment well   ? Behavior During Therapy [facility] for tasks assessed/performed   ? ?  ?  ? ?  ? ? ?No past medical history on file. ? ?Past Surgical History:  ?Procedure Laterality Date  ? APPENDECTOMY    ? age 24  ? FOOT SURGERY Bilateral   ? TONSILLECTOMY    ? age 77  ? ? ?There were no vitals filed for this visit. ? ? Subjective Assessment - 09/25/21 1608   ? ? Subjective Pt reports she does not have pain with anything other than intercourse but improved with stretching prior but does still have a little pain after this does still use lidocaine as well. Pt only using mild ice with sitting on hard surfaces for more than an hour but overall pt very pleased with progress. Pt reports she is using vaginal dilator (largest) vaginally for stretching and massage as needed but also reports using smallest dilator rectally as needed to massage at coccyx. Pt educated to attempt decreased use of rectal dilator if symptoms are not worsening or changing and pt agreed. Pt reports she has not been using it often but sometimes. pt also reports she got a cortisone shot for back pain and this also really helped all coccyx pain as well.   ? How long can you sit comfortably? --   ? How long can you stand comfortably? --   ? How long can you walk comfortably? --   ? Patient Stated Goals to have less pain   ? Currently in Pain? No/denies   ? ?   ?  ? ?  ? ? ? ? ? [facility] PT Assessment - 09/25/21 0001   ? ?  ? Assessment  ? Medical Diagnosis M53.3 (ICD-10-CM) - Coccyx pain  N81.89 (ICD-10-CM) - Weakness of pelvic floor   ? Referring Provider (PT) [name], DO   ? Onset Date/Surgical Date --   2 years  ? Prior Therapy previous PT for Lt knee and back pain   ? ?  ?  ? ?  ? ? ? ? ? ? ? ? ? ? ? ? ? ? ? ? [facility] [place] PT Treatment/Exercise - 09/25/21 0001   ? ?  ? Exercises  ? Exercises Lumbar;Knee/Hip   ?  ? Lumbar Exercises: Stretches  ? Active Hamstring Stretch Right;Left;2 reps;30 seconds   ? Piriformis Stretch Right;Left;1 rep;30 seconds   ? Other Lumbar Stretch Exercise hip hinge stretch 2x30s with toes inward; happy baby x30s; butterfly stretch x30s;   ? Other Lumbar Stretch Exercise hip IR rotation stretch bil x30s each; standing tail wags, standing ant/post rocking at tall mat position, and modified standing cat/cow at table all x10 each with 3-5s hold   ? ?  ?  ? ?  ? ? ? ? ? ? ? ? ? ?  PT Education - 09/25/21 1701   ? ? Education Details Pt educated on continued HEP, dilator use vaginally as ~1 week or every othre week if symptoms continue to improve   ? Person(s) Educated Patient   ? Methods Explanation;Demonstration;Tactile cues;Verbal cues   ? Comprehension Returned demonstration;Verbalized understanding   ? ?  ?  ? ?  ? ? ? PT Short Term Goals - 09/25/21 1705   ? ?  ? PT SHORT TERM GOAL #1  ? Title Pt to be I with HEP   ? Time 5   ? Period Weeks   ? Status Achieved   ? Target Date 03/25/21   ?  ? PT SHORT TERM GOAL #2  ? Title pt to report no more than6/10 pain at coccyx with sitting to increase tolerance to 30 mins without ice pack   ? Time 5   ? Period Weeks   ? Status Achieved   ? Target Date 03/25/21   ?  ? PT SHORT TERM GOAL #3  ? Title pt to demonstrate improved bil hip pain to at least 4/5 globally for improved functional mobility and decreased compensation   ? Time 5   ? Period Weeks   ? Status Achieved   ? Target Date 03/25/21   ?  ? PT  SHORT TERM GOAL #4  ? Title pt to tolerate vaginal dilator size 4 or equivalent for improved tolerance to vaginal penetration   ? Time 5   ? Period Weeks   ? Status Achieved   ? Target Date 03/25/21   ? ?  ?  ? ?  ? ? ? ? PT Long Term Goals - 09/25/21 1705   ? ?  ? PT LONG TERM GOAL #1  ? Baseline pt to be I with advanced HEP   ? Time 4   ? Period Months   ? Status Achieved   ? Target Date 06/20/21   ?  ? PT LONG TERM GOAL #2  ? Title pt to tolerate vaginal dilator size 6 or equivalent for improved tolerance to vaginal penetration   ? Time 4   ? Period Months   ? Status Achieved   ? Target Date 06/20/21   ?  ? PT LONG TERM GOAL #3  ? Title pt to be I with self correction of proper breathing mechanics and core/pelvic floor contract/relaxation with mobility to decrease compensatory strategies   ? Time 4   ? Period Months   ? Status Achieved   ? Target Date 06/20/21   ?  ? PT LONG TERM GOAL #4  ? Title pt to demonstrate improved bil hip pain to at least 455 globally for improved functional mobility and decreased compensation   ? Time 4   ? Period Months   ? Status Achieved   ? Target Date 06/20/21   ?  ? PT LONG TERM GOAL #5  ? Title pt to report no more than 2/10 pain at coccyx with sitting to increase tolerance to 30 mins without ice pack   ? Time 4   ? Period Months   ? Status Achieved   ? Target Date 06/20/21   ? ?  ?  ? ?  ? ? ? ? ? ? ? ? Plan - 09/25/21 1702   ? ? Clinical Impression Statement Pt reports continued improvement however does have pain internally vaginally at Lt side with use of dilator, improves with massage with dilator use and does still use  lidocaine cream for intercourse. Pt reports coccyx pain has improved greatly and now doesn't note limitations with sitting except sitting on hard surface for extended time and will use mild ice. Pt reported she is very pleased with progress and agreeable to DC after today's visit. pt has met all goals.   ? Personal Factors and Comorbidities Time since onset of  injury/illness/exacerbation;Comorbidity 2;Fitness   ? Comorbidities x2 vaginal births, tearing with one, and history of injury to coccyx with falling on it ~2 years ago   ? Examination-Activity Limitations Sit;Sleep   ? Examination-Participation Restrictions Interpersonal Relationship;Yard Work;Community Activity;Driving;Occupation   ? Stability/Clinical Decision Making Evolving/Moderate complexity   ? Rehab Potential Good   ? PT Frequency 2x / week   ? PT Duration Other (comment)   10 weeks  ? PT Treatment/Interventions ADLs/Self Care Home Management;Aquatic Therapy;Functional mobility training;Therapeutic exercise;Traction;Neuromuscular re-education;Manual techniques;Patient/family education;Scar mobilization;Passive range of motion;Dry needling;Energy conservation;Visual/perceptual remediation/compensation;Vasopneumatic Device;Taping;Spinal Manipulations;Joint Manipulations   ? PT Home Exercise Plan Access Code: [email or web address]   ? Consulted and Agree with Plan of Care Patient   ? ?  ?  ? ?  ? ? ?Patient will benefit from skilled therapeutic intervention in order to improve the following deficits and impairments:  Decreased coordination, Increased fascial restricitons, Decreased endurance, Pain, Postural dysfunction, Improper body mechanics, Impaired flexibility, Decreased mobility, Decreased strength ? ?Visit Diagnosis: ?Muscle weakness (generalized) - Plan: PT plan of care cert/re-cert ? ?Other muscle spasm - Plan: PT plan of care cert/re-cert ? ? ? ? ?Problem List ?Patient Active Problem List  ? Diagnosis Date Noted  ? Somatic dysfunction of spine, sacral 02/12/2021  ? Right leg swelling 05/02/2019  ? Coccyx pain 01/10/2019  ? Onychomycosis 01/10/2019  ? Palpitations 02/03/2017  ? Factor V Leiden ([place]) 02/03/2017  ? Left bundle branch block (LBBB) 02/03/2017  ? Vaginal atrophy 12/03/2016  ? 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 Symptomatic menopausal or female climacteric states 03/10/2014   ? ? ? ?PHYSICAL THERAPY DISCHARGE SUMMARY ? ?Visits from Start of Care: 15 ? ?Current functional level related to goals / functional outcomes: ?All goals met ?  ?Remaining deficits: ?All goals met ?

## 2021-09-30 NOTE — Progress Notes (Signed)
? [name] D.Merril Abbe ?[facility] ?[place] ?Phone: [phone] ?  ?Assessment and Plan:   ?  ?1. Chronic bilateral low back pain without sciatica ?2. Chronic right sacroiliac joint pain ?3. Somatic dysfunction of thoracic region ?4. Somatic dysfunction of lumbar region ?5. Somatic dysfunction of pelvic region ?6. Somatic dysfunction of sacral region ?7. Somatic dysfunction of upper extremities ?-Chronic with exacerbation, subsequent visit ?- Recurrence of multiple musculoskeletal complaints with most prominent being in left lower back, right thoracic spine, bilateral shoulders after helping her friend move over the weekend ?- Patient still feels that she is receiving continued relief after epidural injection in 08/2021 ?- Patient has received significant relief with OMT in the past.  Elects for repeat OMT today.  Tolerated well per note below. ?- Decision today to treat with OMT was based on Physical Exam ?  ?After verbal consent patient was treated with HVLA (high velocity low amplitude), ME (muscle energy), FPR (flex positional release), ST (soft tissue), PC/PD (Pelvic Compression/ Pelvic Decompression) techniques in bilateral shoulders, sacrum, thoracic, lumbar, and pelvic areas. Patient tolerated the procedure well with improvement in symptoms.  Patient educated on potential side effects of soreness and recommended to rest, hydrate, and use Tylenol as needed for pain control. ?  ?Pertinent previous records reviewed include none ?  ?Follow Up: 4 weeks for reevaluation of shoulders and repeat OMT ?  ?Subjective:   ?I, [name], am serving as a Education administrator for [employer] ?  ?Chief Complaint: epidural follow up  ?  ?HPI:  ?05/08/21 ?Patient is a 71 year old female presenting with coccyx pain. Patient was last seen by Dr. [name] on 03/26/21 for this reason and had OMT. Today patient states that she has been doing better since going to the pelvic floor  therapist but her lower back has been worse since doing the exercises the therapist gave her to do.  ?  ?06/02/2021  ?Patient states that she is doing really well   ?  ?06/30/2021 ?Patient states that she threw her back out on Friday thinks it could have been when she was walking to the park with her husband had to a change of terrain. Coccyx is feeling much better  ?  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  ? 08/13/2021 ?Patient states that she is feeling the same knee cap is bothering her and the low back is still the same  ? ?  ?09/03/2021 ?Patient states that she thinks she is improving, but still does have some bad days. The only other thing is she has been getting bad cramps in both feet and is not sure if that has anything to do with her issues.  Patient had epidural 2 weeks ago.  She states that overall she had decreased pain radiating into her legs as well as decreased sacral pain and decreased tailbone pain.  She thinks that overall it was beneficial.  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 ? ?10/01/2021 ?Patient states that Friday and saturday and Monday she was helping someone pack and move , right thoracic back left low back are in some pain shoulder upper trap is tight , time for an adjustment  ?  ?Relevant Historical Information:  LBBB, factor V Leiden ? ?Additional pertinent review of systems negative. ? ?Current Outpatient Medications  ?Medication Sig Dispense Refill  ? Ascorbic Acid (VITAMIN C PO) Take 1 capsule by mouth daily.    ? aspirin 81 MG tablet Take 81 mg by mouth daily.    ? B Complex Vitamins (VITAMIN-B COMPLEX) TABS Take 1 tablet by mouth daily.    ? Docusate Calcium (STOOL SOFTENER PO) Take 1 capsule by mouth daily.    ? fish oil-omega-3 fatty acids 1000 MG  capsule Take 2 g by mouth daily.    ? fluticasone (FLONASE) 50 MCG/ACT nasal spray Place into both nostrils daily.    ? ibuprofen (ADVIL,MOTRIN) 200 MG tablet Take 400 mg by mouth as needed for pain.    ? MULTIPLE VITAMIN PO Take 1 tablet by mouth daily.    ? PARoxetine (PAXIL) 10 MG tablet Take 10 mg by mouth daily.    ? Polyethylene Glycol POWD 1 g by Does not apply route daily.    ? gabapentin (NEURONTIN) 100 MG capsule Take 2 capsules (200 mg total) by mouth at bedtime. (Patient not taking: Reported on 10/01/2021) 180 capsule 0  ? ?No current facility-administered medications for this visit.  ?  ?  ?Objective:   ?  ?Vitals:  ? 10/01/21 1430  ?BP: 118/80  ?Pulse: 72  ?SpO2: 96%  ?Weight: 129 lb (58.5 kg)  ?Height: '5\' 2"'$  (1.575 m)  ?  ?  ?Body mass index is 23.59 kg/m?.  ?  ?Physical Exam:   ?  ?General: Well-appearing, cooperative, sitting comfortably in no acute distress.  ? ?OMT Physical Exam: ? ?ASIS Compression Test: Positive left ?Sacrum: Positive sphinx, TTP left sacral base ?Thoracic: TTP paraspinal, T6-9 RRSL ?Lumbar: TTP paraspinal, L 2-4 rlsl ?Pelvis: Left anterior innominate ?Upper extremity: Left upper extremity with positive anterior drawer.  Flexion 160 degrees bilaterally, abduction 160 degrees bilaterally ? ?Electronically signed by:  ?[name] D.Merril Abbe ?[facility] ?2:54 PM 10/01/21 ?

## 2021-10-01 ENCOUNTER — Ambulatory Visit: Payer: PPO | Admitting: Sports Medicine

## 2021-10-01 VITALS — BP 118/80 | HR 72 | Ht 62.0 in | Wt 129.0 lb

## 2021-10-01 DIAGNOSIS — M9902 Segmental and somatic dysfunction of thoracic region: Secondary | ICD-10-CM

## 2021-10-01 DIAGNOSIS — M9907 Segmental and somatic dysfunction of upper extremity: Secondary | ICD-10-CM

## 2021-10-01 DIAGNOSIS — M533 Sacrococcygeal disorders, not elsewhere classified: Secondary | ICD-10-CM

## 2021-10-01 DIAGNOSIS — M9903 Segmental and somatic dysfunction of lumbar region: Secondary | ICD-10-CM

## 2021-10-01 DIAGNOSIS — G8929 Other chronic pain: Secondary | ICD-10-CM

## 2021-10-01 DIAGNOSIS — M9905 Segmental and somatic dysfunction of pelvic region: Secondary | ICD-10-CM

## 2021-10-01 DIAGNOSIS — M545 Low back pain, unspecified: Secondary | ICD-10-CM

## 2021-10-01 DIAGNOSIS — M9904 Segmental and somatic dysfunction of sacral region: Secondary | ICD-10-CM

## 2021-10-01 NOTE — Patient Instructions (Addendum)
Good to see you  ?Shoulder HEP ?4 week follow up for OMT ? ?

## 2021-10-29 ENCOUNTER — Ambulatory Visit: Payer: PPO | Admitting: Sports Medicine

## 2021-10-29 NOTE — Progress Notes (Signed)
? [name] D.Merril Abbe ?[facility] ?[place] ?Phone: [phone] ?  ?Assessment and Plan:   ?  ?1. Chronic bilateral low back pain without sciatica ?2. Chronic right sacroiliac joint pain ?-Chronic with exacerbation, subsequent visit ?- Overall significant improvement in pelvis, sacrum, coccyx discomfort with pelvic floor therapy and OMT.  As patient is currently having no symptoms, we decided to not repeat OMT in these regions at today's visit ? ?3. Chronic bilateral thoracic back pain ?4. Somatic dysfunction of thoracic region ?5. Somatic dysfunction of rib region ?6. Somatic dysfunction of upper extremities ?-Chronic with exacerbation, subsequent visit ?- Overall significant improvement in pelvis, sacrum, coccyx discomfort with pelvic floor therapy and OMT.  As patient is currently having no symptoms, we decided to not repeat OMT in these regions at today's visit ?- Patient had recurrence of upper back pain into shoulders and decided to use OMT at today's visit to target these areas ?- Patient has received significant relief with OMT in the past.  Elects for repeat OMT today.  Tolerated well per note below. ?- Decision today to treat with OMT was based on Physical Exam ?  ?After verbal consent patient was treated with  ME (muscle energy), FPR (flex positional release), ST (soft tissue), PC/PD (Pelvic Compression/ Pelvic Decompression) techniques in thoracic, rib, upper extremity areas. Patient tolerated the procedure well with improvement in symptoms.  Patient educated on potential side effects of soreness and recommended to rest, hydrate, and use Tylenol as needed for pain control. ?  ?Pertinent previous records reviewed include none ?  ?Follow Up: 3 to 4 weeks for reevaluation.  Could repeat OMT if patient has recurrence of pain.  Of note, patient tolerates lower energy manipulation well  ?  ?Subjective:   ?I, [name], am serving as a Education administrator for Doctor  [employer] ?  ?Chief Complaint: epidural follow up  ?  ?HPI:  ?05/08/21 ?Patient is a 71 year old female presenting with coccyx pain. Patient was last seen by Dr. [name] on 03/26/21 for this reason and had OMT. Today patient states that she has been doing better since going to the pelvic floor therapist but her lower back has been worse since doing the exercises the therapist gave her to do.  ?  ?06/02/2021  ?Patient states that she is doing really well   ?  ?06/30/2021 ?Patient states that she threw her back out on Friday thinks it could have been when she was walking to the park with her husband had to a change of terrain. Coccyx is feeling much better  ?  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  ? 08/13/2021 ?Patient states that she is feeling the same knee cap is bothering her and the low back is still the same  ?  ?  ?09/03/2021 ?Patient states that she thinks she is improving, but still does have some bad days. The only other thing is she has been getting bad cramps in both feet and is not sure if that has anything to do with her issues.  Patient had epidural 2 weeks ago.  She states that overall she had decreased pain radiating into her legs as well as decreased sacral pain and decreased tailbone pain.  She thinks that overall it was beneficial.  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 ?  ?10/01/2021 ?Patient states that Friday and saturday and Monday she was helping someone pack and move , right thoracic back left low back are in some pain shoulder upper trap is tight , time for an adjustment  ? ?10/30/2021 ?Patient states last week her knee cap came out of place she had to put it back , has happened in the past and it takes about 3 weeks to return back to normal, tailbone is doing  so much better, traveling to [place] in August   ? ?  ?Relevant Historical Information: LBBB, factor V Leiden ? ?Additional pertinent review of systems negative. ? ?Current Outpatient Medications  ?Medication Sig Dispense Refill  ? Ascorbic Acid (VITAMIN C PO) Take 1 capsule by mouth daily.    ? aspirin 81 MG tablet Take 81 mg by mouth daily.    ? B Complex Vitamins (VITAMIN-B COMPLEX) TABS Take 1 tablet by mouth daily.    ? Docusate Calcium (STOOL SOFTENER PO) Take 1 capsule by mouth daily.    ? fish oil-omega-3 fatty acids 1000 MG capsule Take 2 g by mouth daily.    ? fluticasone (FLONASE) 50 MCG/ACT nasal spray Place into both nostrils daily.    ? gabapentin (NEURONTIN) 100 MG capsule Take 2 capsules (200 mg total) by mouth at bedtime. 180 capsule 0  ? ibuprofen (ADVIL,MOTRIN) 200 MG tablet Take 400 mg by mouth as needed for pain.    ? MULTIPLE VITAMIN PO Take 1 tablet by mouth daily.    ? PARoxetine (PAXIL) 10 MG tablet Take 10 mg by mouth daily.    ? Polyethylene Glycol POWD 1 g by Does not apply route daily.    ? ?No current facility-administered medications for this visit.  ?  ?  ?Objective:   ?  ?Vitals:  ? 10/30/21 1429  ?BP: 118/80  ?Pulse: (!) 103  ?SpO2: 91%  ?Weight: 129 lb (58.5 kg)  ?Height: '5\' 2"'$  (1.575 m)  ?  ?  ?Body mass index is 23.59 kg/m?.  ?  ?Physical Exam:   ?  ?General: Well-appearing, cooperative, sitting comfortably in no acute distress.  ? ?OMT Physical Exam: ? ?Rib: Bilateral elevated first rib with TTP ?Thoracic: TTP paraspinal bilaterally and TTP to bilateral trapezius ?Upper extremity: Restricted motion of bilateral scapula with preference towards protraction ? ?Electronically signed by:  ?[name] D.Merril Abbe ?[facility] ?3:19 PM 10/30/21 ?

## 2021-10-30 ENCOUNTER — Ambulatory Visit: Payer: PPO | Admitting: Sports Medicine

## 2021-10-30 VITALS — BP 118/80 | HR 103 | Ht 62.0 in | Wt 129.0 lb

## 2021-10-30 DIAGNOSIS — G8929 Other chronic pain: Secondary | ICD-10-CM

## 2021-10-30 DIAGNOSIS — M546 Pain in thoracic spine: Secondary | ICD-10-CM | POA: Diagnosis not present

## 2021-10-30 DIAGNOSIS — M545 Low back pain, unspecified: Secondary | ICD-10-CM | POA: Diagnosis not present

## 2021-10-30 DIAGNOSIS — M9902 Segmental and somatic dysfunction of thoracic region: Secondary | ICD-10-CM

## 2021-10-30 DIAGNOSIS — M533 Sacrococcygeal disorders, not elsewhere classified: Secondary | ICD-10-CM | POA: Diagnosis not present

## 2021-10-30 DIAGNOSIS — M9907 Segmental and somatic dysfunction of upper extremity: Secondary | ICD-10-CM

## 2021-10-30 DIAGNOSIS — M9908 Segmental and somatic dysfunction of rib cage: Secondary | ICD-10-CM

## 2021-10-30 NOTE — Patient Instructions (Addendum)
Good to see you  ?Patellar femoral HEP  ?3-4 week follow up  ?

## 2021-11-03 ENCOUNTER — Other Ambulatory Visit: Payer: Self-pay | Admitting: Internal Medicine

## 2021-11-03 DIAGNOSIS — R921 Mammographic calcification found on diagnostic imaging of breast: Secondary | ICD-10-CM

## 2021-12-08 ENCOUNTER — Ambulatory Visit
Admission: RE | Admit: 2021-12-08 | Discharge: 2021-12-08 | Disposition: A | Discharge status: Home / Self Care | Payer: PPO | Source: Ambulatory Visit | Attending: Internal Medicine | Admitting: Internal Medicine

## 2021-12-08 DIAGNOSIS — R921 Mammographic calcification found on diagnostic imaging of breast: Secondary | ICD-10-CM

## 2021-12-10 ENCOUNTER — Encounter (INDEPENDENT_AMBULATORY_CARE_PROVIDER_SITE_OTHER): Payer: PPO | Admitting: Ophthalmology

## 2021-12-16 ENCOUNTER — Encounter (INDEPENDENT_AMBULATORY_CARE_PROVIDER_SITE_OTHER): Payer: PPO | Admitting: Ophthalmology

## 2021-12-19 ENCOUNTER — Encounter (INDEPENDENT_AMBULATORY_CARE_PROVIDER_SITE_OTHER): Payer: PPO | Admitting: Ophthalmology

## 2021-12-19 DIAGNOSIS — H43813 Vitreous degeneration, bilateral: Secondary | ICD-10-CM

## 2021-12-19 DIAGNOSIS — H348322 Tributary (branch) retinal vein occlusion, left eye, stable: Secondary | ICD-10-CM | POA: Diagnosis not present

## 2021-12-19 DIAGNOSIS — H33302 Unspecified retinal break, left eye: Secondary | ICD-10-CM

## 2022-01-08 NOTE — Progress Notes (Signed)
Kayla [name] D.[place] Phone: [phone]   Assessment and Plan:     1. Acute bilateral low back pain without sciatica 2. Somatic dysfunction of cervical region 3. Somatic dysfunction of thoracic region 4. Somatic dysfunction of lumbar region 5. Somatic dysfunction of pelvic region 6. Somatic dysfunction of rib region -Chronic with exacerbation, subsequent sports medicine visit - Recurrence of musculoskeletal complaints with most prominent being low back.  Overall patient has been doing significantly better with regular OMT visits - Patient will be traveling to [place] for the next 2 months to take care of a newborn a 71-year-old.  Stressed the importance of proper lifting technique with legs to avoid back injury - Continue HEP for low back and start HEP for neck and trapezius to prevent flares of pain while away - Patient has received significant relief with OMT in the past.  Elects for repeat OMT today.  Tolerated well per note below. - Decision today to treat with OMT was based on Physical Exam   After verbal consent patient was treated with HVLA (high velocity low amplitude), ME (muscle energy), FPR (flex positional release), ST (soft tissue), PC/PD (Pelvic Compression/ Pelvic Decompression) techniques in cervical, rib, thoracic, lumbar, and pelvic areas. Patient tolerated the procedure well with improvement in symptoms.  Patient educated on potential side effects of soreness and recommended to rest, hydrate, and use Tylenol as needed for pain control.   Pertinent previous records reviewed include none   Follow Up: As needed after returning from [place].  Could consider repeat OMT based on symptoms   Subjective:   I, Kayla [name], am serving as a Education administrator for [name]   Chief Complaint: epidural follow up    HPI:  05/08/21 Patient is a 71 year old female presenting with coccyx pain.  Patient was last seen by Dr. [name] on 03/26/21 for this reason and had OMT. Today patient states that she has been doing better since going to the pelvic floor therapist but her lower back has been worse since doing the exercises the therapist gave her to do.    06/02/2021  Patient states that she is doing really well     06/30/2021 Patient states that she threw her back out on Friday thinks it could have been when she was walking to the park with her husband had to a change of terrain. 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 The only other thing is she has been getting bad cramps in both feet and is not sure if that has anything to do with her issues.  Patient had epidural 2 weeks ago.  She states that overall she had decreased pain radiating into her legs as well as decreased sacral pain and decreased tailbone pain.  She thinks that overall it was beneficial.  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   10/01/2021 Patient states that Friday  and saturday and Monday she was helping someone pack and move , right thoracic back left low back are in some pain shoulder upper trap is tight , time for an adjustment    10/30/2021 Patient states last week her knee cap came out of place she had to put it back , has happened in the past and it takes about 3 weeks to return back to normal, tailbone is doing so much better, traveling to [place] in August     01/09/2022 Patient states tail bone is doing better, states she threw her back out  week of 4th of July so her low back is in pain    Relevant Historical Information: LBBB, factor V Leiden  Additional pertinent review of systems negative.  Current Outpatient Medications  Medication Sig Dispense Refill   Ascorbic Acid (VITAMIN C PO) Take 1 capsule by mouth daily.     aspirin 81 MG tablet Take 81 mg by mouth daily.     B Complex Vitamins (VITAMIN-B COMPLEX) TABS Take 1 tablet by mouth daily.     Docusate Calcium (STOOL SOFTENER PO) Take 1 capsule by mouth daily.     fish oil-omega-3 fatty acids 1000 MG capsule Take 2 g by mouth daily.     fluticasone (FLONASE) 50 MCG/ACT nasal spray Place into both nostrils daily.     gabapentin (NEURONTIN) 100 MG capsule Take 2 capsules (200 mg total) by mouth at bedtime. 180 capsule 0   ibuprofen (ADVIL,MOTRIN) 200 MG tablet Take 400 mg by mouth as needed for pain.     MULTIPLE VITAMIN PO Take 1 tablet by mouth daily.     PARoxetine (PAXIL) 10 MG tablet Take 10 mg by mouth daily.     Polyethylene Glycol POWD 1 g by Does not apply route daily.     No current facility-administered medications for this visit.      Objective:     Vitals:   01/09/22 0929  BP: 122/78  Pulse: 77  SpO2: 97%  Weight: 130 lb (59 kg)  Height: '5\' 2"'$  (1.575 m)      Body mass index is 23.78 kg/m.    Physical Exam:     General: Well-appearing, cooperative, sitting comfortably in no acute distress.   OMT Physical Exam:  ASIS Compression Test: Positive Right Cervical: TTP paraspinal, C3-5 RRSR Rib: Bilateral elevated first rib with TTP, worse on right Thoracic: TTP paraspinal, increased kyphosis with limited motion through thoracic spine Lumbar: TTP paraspinal, L1-3 RRSL Pelvis: Right anterior innominate  Electronically signed by:  Kayla [name] D.[name] 9:53 AM 01/09/22

## 2022-01-09 ENCOUNTER — Ambulatory Visit: Payer: PPO | Admitting: Sports Medicine

## 2022-01-09 VITALS — BP 122/78 | HR 77 | Ht 62.0 in | Wt 130.0 lb

## 2022-01-09 DIAGNOSIS — M545 Low back pain, unspecified: Secondary | ICD-10-CM | POA: Diagnosis not present

## 2022-01-09 DIAGNOSIS — M9901 Segmental and somatic dysfunction of cervical region: Secondary | ICD-10-CM | POA: Diagnosis not present

## 2022-01-09 DIAGNOSIS — M9902 Segmental and somatic dysfunction of thoracic region: Secondary | ICD-10-CM

## 2022-01-09 DIAGNOSIS — M9905 Segmental and somatic dysfunction of pelvic region: Secondary | ICD-10-CM

## 2022-01-09 DIAGNOSIS — M9903 Segmental and somatic dysfunction of lumbar region: Secondary | ICD-10-CM

## 2022-01-09 DIAGNOSIS — M9908 Segmental and somatic dysfunction of rib cage: Secondary | ICD-10-CM

## 2022-01-09 NOTE — Patient Instructions (Addendum)
Good to see you Neck and trap HEP  Tylenol [phone] mg 2x times a day for pain relief  As needed follow up

## 2022-01-16 ENCOUNTER — Other Ambulatory Visit: Payer: Self-pay | Admitting: Otolaryngology

## 2022-01-16 DIAGNOSIS — R221 Localized swelling, mass and lump, neck: Secondary | ICD-10-CM

## 2022-01-20 ENCOUNTER — Ambulatory Visit
Admission: RE | Admit: 2022-01-20 | Discharge: 2022-01-20 | Disposition: A | Discharge status: Home / Self Care | Payer: PPO | Source: Ambulatory Visit | Attending: Otolaryngology | Admitting: Otolaryngology

## 2022-01-20 DIAGNOSIS — R221 Localized swelling, mass and lump, neck: Secondary | ICD-10-CM

## 2022-01-20 MED ORDER — IOPAMIDOL (ISOVUE-300) INJECTION 61%
100.0000 mL | Freq: Once | INTRAVENOUS | Status: AC | PRN
  Administered 2022-01-20: 100 mL via INTRAVENOUS

## 2022-07-15 ENCOUNTER — Ambulatory Visit (INDEPENDENT_AMBULATORY_CARE_PROVIDER_SITE_OTHER): Payer: PPO

## 2022-07-15 ENCOUNTER — Ambulatory Visit (INDEPENDENT_AMBULATORY_CARE_PROVIDER_SITE_OTHER): Payer: PPO | Admitting: Sports Medicine

## 2022-07-15 VITALS — BP 110/78 | HR 68 | Ht 62.0 in | Wt 133.0 lb

## 2022-07-15 DIAGNOSIS — M25561 Pain in right knee: Secondary | ICD-10-CM

## 2022-07-15 DIAGNOSIS — M25562 Pain in left knee: Secondary | ICD-10-CM

## 2022-07-15 DIAGNOSIS — M1712 Unilateral primary osteoarthritis, left knee: Secondary | ICD-10-CM | POA: Diagnosis not present

## 2022-07-15 DIAGNOSIS — G8929 Other chronic pain: Secondary | ICD-10-CM

## 2022-07-15 DIAGNOSIS — M1711 Unilateral primary osteoarthritis, right knee: Secondary | ICD-10-CM | POA: Diagnosis not present

## 2022-07-15 NOTE — Patient Instructions (Addendum)
Good to see you  Note provided  May use Tylenol as needed for day-to-day pain relief 4 week follow up

## 2022-07-15 NOTE — Progress Notes (Signed)
Kayla [name] D.[place] Phone: [phone]   Assessment and Plan:     1. Chronic pain of both knees 2. Primary osteoarthritis of left knee 3. Primary osteoarthritis of right knee  -Chronic with exacerbation, initial sports medicine visit - Recurrence of bilateral knee pain with patient's stating that kneecaps will dislocate.  Based on description and x-ray, patient may be experiencing mild subluxations, though I do not full dislocations based on relatively unremarkable patella appearance on x-rays and no increased laxity on physical exam - I suspect patient's symptoms are more related to medial knee osteoarthritis as seen on x-ray with patellofemoral syndrome potentially contributing - We will start with conservative therapy of home exercise program for knees and patellofemoral syndrome - Do not recommend NSAID use with past medical history of factor V Leiden - X-rays obtained in clinic.  My interpretation: No acute fracture or dislocation.  Mildly decreased medial joint space bilaterally  Pertinent previous records reviewed include none   Follow Up: 4 weeks for reevaluation.  If no improvement or worsening of symptoms, could consider prednisone course versus intra-articular CSI.  If symptoms have improved, could consider restarting OMT   Subjective:   I, Kayla [name], am serving as a Education administrator for Kayla [name]   Chief Complaint: epidural follow up    HPI:  05/08/21 Patient is a 72 year old female presenting with coccyx pain. Patient was last seen by Dr. [name] on 03/26/21 for this reason and had OMT. Today patient states that she has been doing better since going to the pelvic floor therapist but her lower back has been worse since doing the exercises the therapist gave her to do.    06/02/2021  Patient states that she is doing really well     06/30/2021 Patient states that she threw her back out on Friday  thinks it could have been when she was walking to the park with her husband had to a change of terrain. 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 The only other thing is she has been getting bad cramps in both feet and is not sure if that has anything to do with her issues.  Patient had epidural 2 weeks ago.  She states that overall she had decreased pain radiating into her legs as well as decreased sacral pain and decreased tailbone pain.  She thinks that overall it was beneficial.  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   10/01/2021 Patient states that Friday and saturday and Monday she was helping someone pack and move , right thoracic back left low back are in some pain shoulder upper trap is tight , time for an adjustment    10/30/2021 Patient states last week her knee cap came out of place she had to put it back , has happened in the past and it takes about 3 weeks to return back to normal, tailbone  is doing so much better, traveling to [place] in August     01/09/2022 Patient states tail bone is doing better, states she threw her back out week of 4th of July so her low back is in pain    07/15/2022 Patient states she had bilateral subluxations of the knee cap a week or so ago, but only had to use her back brace once while in [place]   Relevant Historical Information: LBBB, factor V Leiden  Additional pertinent review of systems negative.  Current Outpatient Medications  Medication Sig Dispense Refill   Ascorbic  Acid (VITAMIN C PO) Take 1 capsule by mouth daily.     aspirin 81 MG tablet Take 81 mg by mouth daily.     B Complex Vitamins (VITAMIN-B COMPLEX) TABS Take 1 tablet by mouth daily.     Docusate Calcium (STOOL SOFTENER PO) Take 1 capsule by mouth daily.     fish oil-omega-3 fatty acids 1000 MG capsule Take 2 g by mouth daily.     fluticasone (FLONASE) 50 MCG/ACT nasal spray Place into both nostrils daily.     MULTIPLE VITAMIN PO Take 1 tablet by mouth daily.     PARoxetine (PAXIL) 10 MG tablet Take 10 mg by mouth daily.     Polyethylene Glycol POWD 1 g by Does not apply route daily.     No current facility-administered medications for this visit.      Objective:     Vitals:   07/15/22 1318  BP: 110/78  Pulse: 68  SpO2: 94%  Weight: 133 lb (60.3 kg)  Height: '5\' 2"'$  (1.575 m)      Body mass index is 24.33 kg/m.    Physical Exam:     General:  awake, alert oriented, no acute distress nontoxic Skin: no suspicious lesions or rashes Neuro:sensation intact and strength 5/5 with no deficits, no atrophy, normal muscle tone Psych: No signs of anxiety, depression or other mood disorder  Bilateral knee: No swelling No deformity Neg fluid wave, joint milking ROM Flex 110 , Ext 0  TTP patella, medial joint line, medial femoral condyle NTTP over the quad tendon,, lat fem condyle, , plica, patella tendon, tibial tuberostiy, fibular head, posterior fossa, pes anserine bursa, gerdy's tubercle, lateral jt line Neg anterior and posterior drawer Neg lachman Neg sag sign Negative varus stress Negative valgus stress Negative McMurray Positive Thessaly for medial knee pain  Gait normal   Electronically signed by:  Kayla [name] D.[name] 2:24 PM 07/15/22

## 2022-08-20 NOTE — Progress Notes (Signed)
Kayla [name] D.[place] Phone: [phone]   Assessment and Plan:     1. Chronic pain of both knees 2. Primary osteoarthritis of left knee 3. Primary osteoarthritis of right knee -Chronic with exacerbation, subsequent visit - Continued bilateral knee pain.  Patient states that she had a repeat dislocation of left patella which is led to ongoing left knee pain and right knee pain due to compensation.  Patient states that she has dislocated both kneecaps in the past, though I do not feel significant laxity on physical exam.  Question subluxation versus alternative diagnosis - Patient has failed conservative therapy which is included HEP, activity modification directed by myself since 01/09/2022 with relatively unremarkable physical exam and x-ray findings.  Because of this we will proceed with bilateral knee MRI to further evaluate in the context of patient stating she has had knee Dislocations - Start prednisone Dosepak for pain relief while awaiting MRIs  4. Chronic bilateral low back pain without sciatica 5. Neck pain 6. Chronic bilateral thoracic back pain  -Chronic with exacerbation, subsequent visit - Patient presents for multiple musculoskeletal complaints including neck, upper back, lower back pain that have flared since previous office visit - Start prednisone Dosepak - Continue HEP  Pertinent previous records reviewed include none   Follow Up: 3 days after MRIs to review results.  Could consider repeat OMT for other musculoskeletal complaints   Subjective:   I, Kayla [name], am serving as a Education administrator for Doctor Kayla [name]   Chief Complaint: epidural follow up    HPI:  05/08/21 Patient is a 72 year old female presenting with coccyx pain. Patient was last seen by Dr. Tamala [name] on 03/26/21 for this reason and had OMT. Today patient states that she has been doing better since going to the pelvic  floor therapist but her lower back has been worse since doing the exercises the therapist gave her to do.    06/02/2021  Patient states that she is doing really well     06/30/2021 Patient states that she threw her back out on Friday thinks it could have been when she was walking to the park with her husband had to a change of terrain. 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 The only other thing is she has been getting bad cramps in both feet and is not sure if that has anything to do with her issues.  Patient had epidural 2 weeks ago.  She states that overall she had decreased pain radiating into her legs as well as decreased sacral pain and decreased tailbone pain.  She thinks that overall it was beneficial.  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   10/01/2021 Patient states that Friday and saturday and Monday she was helping someone pack and move , right thoracic back left low back are in some pain shoulder upper trap is tight , time for  an adjustment    10/30/2021 Patient states last week her knee cap came out of place she had to put it back , has happened in the past and it takes about 3 weeks to return back to normal, tailbone is doing so much better, traveling to [place] in August     01/09/2022 Patient states tail bone is doing better, states she threw her back out week of 4th of July so her low back is in pain    07/15/2022 Patient states she had bilateral subluxations of the knee cap a week or so ago, but only  had to use her back brace once while in [place]  08/21/2022 Patient states left knee is in pain , "knee jumped out of place 2 Fridays ago" 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   Current Outpatient Medications:    Ascorbic Acid (VITAMIN C PO), Take 1 capsule by mouth daily., Disp: , Rfl:    aspirin 81 MG tablet, Take 81 mg by mouth daily., Disp: , Rfl:    B Complex Vitamins (VITAMIN-B COMPLEX) TABS, Take 1 tablet by mouth daily., Disp: , Rfl:    Docusate Calcium (STOOL SOFTENER PO), Take 1 capsule by mouth daily., Disp: , Rfl:    fish oil-omega-3 fatty acids 1000 MG capsule, Take 2 g by mouth daily., Disp: , Rfl:    fluticasone (FLONASE) 50 MCG/ACT nasal spray, Place into both nostrils daily., Disp: , Rfl:    methylPREDNISolone (MEDROL DOSEPAK) 4 MG TBPK tablet, Take 6 tablets on day 1.  Take 5 tablets on day 2.  Take 4 tablets on day 3.  Take 3 tablets on day 4.  Take 2 tablets on day 5.  Take 1 tablet on day 6., Disp: 21 tablet, Rfl: 0   MULTIPLE VITAMIN PO, Take 1 tablet by mouth daily., Disp: , Rfl:    PARoxetine (PAXIL) 10 MG tablet, Take 10 mg by mouth daily., Disp: , Rfl:    Polyethylene Glycol POWD, 1 g by Does not apply route daily., Disp: , Rfl:    Objective:     Vitals:   08/21/22 1356  BP: 120/80  Pulse: (!) 108  SpO2: 99%  Weight: 133 lb (60.3 kg)  Height: '5\' 2"'$  (1.575 m)      Body mass index is 24.33 kg/m.    Physical Exam:    General:  awake, alert oriented, no acute distress nontoxic Skin: no suspicious lesions or rashes Neuro:sensation intact and strength 5/5 with no deficits, no atrophy, normal muscle tone Psych: No signs of anxiety, depression or other mood disorder   Bilateral knee: No swelling No deformity Neg fluid wave, joint milking ROM Flex 110 , Ext 0  TTP patella, medial joint line, medial femoral condyle NTTP over  the quad tendon,, lat fem condyle, , plica, patella tendon, tibial tuberostiy, fibular head, posterior fossa, pes anserine bursa, gerdy's tubercle, lateral jt line Neg anterior and posterior drawer Neg lachman Neg sag sign Negative varus stress Negative valgus stress Negative McMurray Positive Thessaly for medial knee pain   Gait normal     Electronically signed by:  Kayla [name] D.[name] 2:11 PM 08/21/22

## 2022-08-21 ENCOUNTER — Ambulatory Visit (INDEPENDENT_AMBULATORY_CARE_PROVIDER_SITE_OTHER): Payer: PPO | Admitting: Sports Medicine

## 2022-08-21 VITALS — BP 120/80 | HR 108 | Ht 62.0 in | Wt 133.0 lb

## 2022-08-21 DIAGNOSIS — M25561 Pain in right knee: Secondary | ICD-10-CM

## 2022-08-21 DIAGNOSIS — M545 Low back pain, unspecified: Secondary | ICD-10-CM | POA: Diagnosis not present

## 2022-08-21 DIAGNOSIS — M1711 Unilateral primary osteoarthritis, right knee: Secondary | ICD-10-CM

## 2022-08-21 DIAGNOSIS — M25562 Pain in left knee: Secondary | ICD-10-CM

## 2022-08-21 DIAGNOSIS — G8929 Other chronic pain: Secondary | ICD-10-CM

## 2022-08-21 DIAGNOSIS — M546 Pain in thoracic spine: Secondary | ICD-10-CM

## 2022-08-21 DIAGNOSIS — M1712 Unilateral primary osteoarthritis, left knee: Secondary | ICD-10-CM | POA: Diagnosis not present

## 2022-08-21 DIAGNOSIS — M542 Cervicalgia: Secondary | ICD-10-CM

## 2022-08-21 MED ORDER — METHYLPREDNISOLONE 4 MG PO TBPK: ORAL_TABLET | ORAL | 0 refills | Status: DC

## 2022-08-21 NOTE — Patient Instructions (Addendum)
Good to see you  MRI knee referral  Prednisone dos pak  Follow up 3 days after MRI to discuss results

## 2022-09-01 ENCOUNTER — Other Ambulatory Visit: Payer: PPO

## 2022-09-07 NOTE — Progress Notes (Deleted)
x

## 2022-09-08 ENCOUNTER — Ambulatory Visit (INDEPENDENT_AMBULATORY_CARE_PROVIDER_SITE_OTHER): Payer: PPO

## 2022-09-08 ENCOUNTER — Other Ambulatory Visit: Payer: PPO

## 2022-09-08 DIAGNOSIS — M1711 Unilateral primary osteoarthritis, right knee: Secondary | ICD-10-CM | POA: Diagnosis not present

## 2022-09-08 DIAGNOSIS — M25562 Pain in left knee: Secondary | ICD-10-CM | POA: Diagnosis not present

## 2022-09-08 DIAGNOSIS — M1712 Unilateral primary osteoarthritis, left knee: Secondary | ICD-10-CM

## 2022-09-08 DIAGNOSIS — M542 Cervicalgia: Secondary | ICD-10-CM

## 2022-09-08 DIAGNOSIS — M545 Low back pain, unspecified: Secondary | ICD-10-CM

## 2022-09-08 DIAGNOSIS — G8929 Other chronic pain: Secondary | ICD-10-CM

## 2022-09-08 DIAGNOSIS — M25561 Pain in right knee: Secondary | ICD-10-CM

## 2022-09-11 ENCOUNTER — Ambulatory Visit (INDEPENDENT_AMBULATORY_CARE_PROVIDER_SITE_OTHER): Payer: PPO | Admitting: Sports Medicine

## 2022-09-11 VITALS — BP 132/82 | HR 73 | Ht 62.0 in | Wt 132.0 lb

## 2022-09-11 DIAGNOSIS — G8929 Other chronic pain: Secondary | ICD-10-CM

## 2022-09-11 DIAGNOSIS — M25562 Pain in left knee: Secondary | ICD-10-CM | POA: Diagnosis not present

## 2022-09-11 DIAGNOSIS — M1711 Unilateral primary osteoarthritis, right knee: Secondary | ICD-10-CM | POA: Diagnosis not present

## 2022-09-11 DIAGNOSIS — M1712 Unilateral primary osteoarthritis, left knee: Secondary | ICD-10-CM | POA: Diagnosis not present

## 2022-09-11 DIAGNOSIS — M25561 Pain in right knee: Secondary | ICD-10-CM | POA: Diagnosis not present

## 2022-09-11 NOTE — Progress Notes (Signed)
[name] D.Doniphan [place] Phone: [phone]   Assessment and Plan:     1. Chronic pain of both knees 2. Primary osteoarthritis of left knee 3. Primary osteoarthritis of right knee  -Chronic with exacerbation, subsequent visit - Continued bilateral knee pain.  Patient feels that bilateral kneecaps are dislocating when she is sleeping or when she is active.  Patient has no patellar laxity on physical exam and no evidence of dislocations on bilateral MRIs or x-rays.  I feel patient is more likely experiencing pain from bilateral knee osteoarthritis that is causing flares of knee pain and instability episodes - Patient is agreeable to intra-articular CSI.  Tolerated well per note below - Offered physical therapy which patient declined at this time, though could be considered at future visit - Use Tylenol for day-to-day pain relief - Reviewed MRI with the patient.  Showed small meniscal tears bilaterally and bilateral osteoarthritis, more significant interval left knee compared to right.  Procedure: Knee Joint Injection Side: Bilateral Indication: Flare of osteoarthritis  Risks explained and consent was given verbally. The site was cleaned with alcohol prep. A needle was introduced with an anterio-lateral approach. Injection given using 54mL of 1% lidocaine without epinephrine and 32mL of kenalog 40mg /ml. This was well tolerated and resulted in symptomatic relief.  Needle was removed, hemostasis achieved, and post injection instructions were explained.  Procedure was repeated on contralateral side pt was advised to call or return to clinic if these symptoms worsen or fail to improve as anticipated.   Pertinent previous records reviewed include bilateral knee MRIs 09/08/2022   Follow Up: 2 to 3 weeks for reevaluation.  Could discuss physical therapy for knees or further discuss patient's ongoing neck pain   Subjective:    I, Kayla [name], am serving as a Education administrator for [name]   Chief Complaint: epidural follow up    HPI:  05/08/21 Patient is a 71 year old female presenting with coccyx pain. Patient was last seen by Dr. [name] on 03/26/21 for this reason and had OMT. Today patient states that she has been doing better since going to the pelvic floor therapist but her lower back has been worse since doing the exercises the therapist gave her to do.    06/02/2021  Patient states that she is doing really well     06/30/2021 Patient states that she threw her back out on Friday thinks it could have been when she was walking to the park with her husband had to a change of terrain. 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 The only other thing is she has been getting bad cramps in both feet and is not sure if that has anything to do with her issues.  Patient had epidural 2 weeks ago.  She states that overall she had decreased pain radiating into her legs as well as decreased sacral pain and decreased tailbone pain.  She thinks that overall it was beneficial.  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   10/01/2021 Patient states that Friday and saturday and Monday she was helping someone pack and move , right thoracic back left low back are in some pain shoulder upper trap is tight , time for an adjustment    10/30/2021 Patient states last week her knee cap came out of place she had to put it back , has happened in the past and it takes  about 3 weeks to return back to normal, tailbone is doing so much better, traveling to [place] in August     01/09/2022 Patient states tail bone is doing better, states she threw her back out week of 4th of July so her low back is in pain    07/15/2022 Patient states she had bilateral subluxations of the knee cap a week or so ago, but only had to use her back brace once while in [place]   08/21/2022 Patient states left knee is in pain , "knee jumped out of place 2 Fridays ago" 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   Current Outpatient Medications:    Ascorbic Acid (VITAMIN C PO), Take 1 capsule by mouth daily., Disp: , Rfl:    aspirin 81 MG tablet, Take 81 mg by mouth daily., Disp: , Rfl:    B Complex Vitamins (VITAMIN-B COMPLEX) TABS, Take 1 tablet by mouth daily., Disp: , Rfl:    Docusate Calcium (STOOL SOFTENER PO), Take 1 capsule by mouth daily., Disp: , Rfl:    fish oil-omega-3 fatty acids 1000 MG capsule, Take 2 g by mouth daily., Disp: , Rfl:    fluticasone (FLONASE) 50 MCG/ACT nasal spray, Place into both nostrils daily., Disp: , Rfl:    methylPREDNISolone (MEDROL DOSEPAK) 4 MG TBPK tablet, Take 6 tablets on day 1.  Take 5 tablets on day 2.  Take 4 tablets on day 3.  Take 3 tablets on day 4.  Take 2 tablets on day 5.  Take 1 tablet on day 6., Disp: 21 tablet, Rfl: 0   MULTIPLE VITAMIN PO, Take 1 tablet by mouth daily., Disp: , Rfl:    PARoxetine (PAXIL) 10 MG tablet, Take 10 mg by mouth daily., Disp: , Rfl:    Polyethylene Glycol POWD, 1 g by Does not apply route daily., Disp: , Rfl:    Objective:     Vitals:   09/11/22 1433  BP: 132/82  Pulse: 73  SpO2: 99%  Weight: 132 lb (59.9 kg)   Height: 5\' 2"  (1.575 m)      Body mass index is 24.14 kg/m.    Physical Exam:    General:  awake, alert oriented, no acute distress nontoxic Skin: no suspicious lesions or rashes Neuro:sensation intact and strength 5/5 with no deficits, no atrophy, normal muscle tone Psych: No signs of anxiety, depression or other mood disorder   Bilateral knee: No swelling No deformity Neg fluid wave, joint milking ROM Flex 110 , Ext 0  TTP patella, medial joint line, medial femoral condyle NTTP over the quad tendon,, lat fem condyle, , plica, patella tendon, tibial tuberostiy, fibular head, posterior fossa,  pes anserine bursa, gerdy's tubercle, lateral jt line Neg anterior and posterior drawer Neg lachman Neg sag sign Negative varus stress Negative valgus stress Negative McMurray Positive Thessaly for medial knee pain   Gait normal     Electronically signed by:  [name] D.[name] 2:52 PM 09/11/22

## 2022-09-11 NOTE — Patient Instructions (Addendum)
Good to see you Neck HEP  2-3 week follow up

## 2022-09-18 ENCOUNTER — Encounter (INDEPENDENT_AMBULATORY_CARE_PROVIDER_SITE_OTHER): Payer: PPO | Admitting: Ophthalmology

## 2022-09-18 DIAGNOSIS — H33302 Unspecified retinal break, left eye: Secondary | ICD-10-CM | POA: Diagnosis not present

## 2022-09-18 DIAGNOSIS — H348322 Tributary (branch) retinal vein occlusion, left eye, stable: Secondary | ICD-10-CM

## 2022-09-18 DIAGNOSIS — H43813 Vitreous degeneration, bilateral: Secondary | ICD-10-CM

## 2022-09-30 NOTE — Progress Notes (Signed)
Kayla SellsBen Nicolas [name] D.Kayla [name]. CAQSM [facility] [address] [place] [id number] Phone: [phone]   Assessment and Plan:     1. Neck pain 2. Chronic bilateral thoracic back pain 3. Somatic dysfunction of cervical region 4. Somatic dysfunction of thoracic region 5. Somatic dysfunction of rib region -Chronic with exacerbation, subsequent sports medicine visit - Recurrence of neck and upper back pain, primarily around shoulder blades and rhomboids and thoracic/cervical paraspinal - Suspect flares are mostly muscular in nature based on HPI and physical exam and relatively unremarkable CT neck from 01/20/2022 - Continue HEP for neck and start HEP for scapula - Patient has received significant relief with OMT in the past.  Elects for repeat OMT today.  Tolerated well per note below. - Patient had significant tenderness out of proportion to physical exam, so no HVLA was performed at today's visit - Decision today to treat with OMT was based on Physical Exam  After verbal consent patient was treated with  ME (muscle energy), FPR (flex positional release), ST (soft tissue), PC/PD (Pelvic Compression/ Pelvic Decompression) techniques in cervical, rib, thoracic, lumbar, and pelvic areas. Patient tolerated the procedure well with improvement in symptoms.  Patient educated on potential side effects of soreness and recommended to rest, hydrate, and use Tylenol as needed for pain control.   6. Chronic pain of both knees  -Chronic with exacerbation, subsequent sports medicine visit - Patient has received essentially no relief with bilateral intra-articular CSI performed at previous office visit on 09/11/2022.  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  Still does not have patellar laxity on physical exam and no evidence of dislocations on bilateral MRIs or x-rays.  I still feel that patient is more likely  experiencing knee pain from bilateral knee osteoarthritis - Due to failure to improve with conservative therapy, patient has decided to establish care with orthopedic surgery.  I agree with this plan  Pertinent previous records reviewed include none   Follow Up: 3 to 4 weeks for reevaluation.  Could consider repeat OMT for neck and thoracic regions if patient found today's treatment beneficial   Subjective:   I, Kayla [name], am serving as a Neurosurgeonscribe for Kayla [name]   Chief Complaint: epidural follow up    HPI:  05/08/21 Patient is a 72 year old female presenting with coccyx pain. Patient was last seen by Dr. Katrinka BlazingSmith on 03/26/21 for this reason and had OMT. Today patient states that she has been doing better since going to the pelvic floor therapist but her lower back has been worse since doing the exercises the therapist gave her to do.    06/02/2021  Patient states that she is doing really well     06/30/2021 Patient states that she threw her back out on Friday thinks it could have been when she was walking to the park with her husband had to a change of terrain. 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 The only other thing is she has been getting bad cramps in both feet and is not sure if that has anything to do with her issues.  Patient had epidural 2 weeks ago.  She states that overall she had decreased pain radiating into her legs as well as decreased sacral pain and decreased tailbone pain.  She thinks that overall it was beneficial.  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   10/01/2021 Patient states that Friday and saturday and Monday she was helping someone pack and move , right thoracic back left low back are in some pain shoulder upper trap is tight , time for an adjustment    10/30/2021 Patient states last week her knee cap came out of place she had to put it back , has happened in the past and it takes about 3 weeks to return back to normal, tailbone is doing so much better, traveling to [place] in August     01/09/2022 Patient states tail bone is doing better, states she threw her back out week of 4th of July so her low back is in pain    07/15/2022 Patient states she had bilateral subluxations of the knee cap a week or so ago, but only had to use her back brace once while in [place]   08/21/2022 Patient states left knee is in pain , "knee jumped out of place 2 Fridays ago" 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    09/11/2022 Patient states that after she has back to back classes her knee feels unstable , also when she climbs into bed she has pain bilat but more on the left   10/02/2022 Patient states CSI helped left and not right she is going to get a second opinion , she has neck and shoulder pain and upper trap pain , she has to sleep on one side and thinks that's where the tightness and pain are coming from she has pain in her thoracic back pain she has tightness she uses icey-hot  and can't take ibu  Relevant Historical Information: LBBB, factor V Leiden  Additional pertinent review of systems negative.   Current Outpatient Medications:    Ascorbic Acid (VITAMIN C PO), Take 1 capsule by mouth daily., Disp: , Rfl:    aspirin 81 MG tablet, Take 81 mg by mouth daily., Disp: , Rfl:    B Complex Vitamins (VITAMIN-B COMPLEX) TABS, Take 1 tablet by mouth daily., Disp: , Rfl:    Docusate Calcium (STOOL SOFTENER PO), Take 1 capsule by mouth daily., Disp: , Rfl:     fish oil-omega-3 fatty acids 1000 MG capsule, Take 2 g by mouth daily., Disp: , Rfl:    fluticasone (FLONASE) 50 MCG/ACT nasal spray, Place into both nostrils daily., Disp: , Rfl:    methylPREDNISolone (MEDROL DOSEPAK) 4 MG TBPK tablet, Take 6 tablets on day 1.  Take 5 tablets on day 2.  Take 4 tablets on day 3.  Take 3 tablets on day 4.  Take 2 tablets on day 5.  Take 1 tablet on day 6., Disp: 21 tablet, Rfl: 0   MULTIPLE VITAMIN PO, Take 1 tablet by mouth daily., Disp: , Rfl:    PARoxetine (PAXIL) 10 MG tablet, Take 10 mg by mouth daily., Disp: , Rfl:    Polyethylene Glycol POWD, 1 g by Does not apply route daily., Disp: , Rfl:  Objective:     Vitals:   10/02/22 1432  Pulse: 75  SpO2: 98%  Weight: 132 lb (59.9 kg)  Height: 5\' 2"  (1.575 m)      Body mass index is 24.14 kg/m.    Physical Exam:    General: Well-appearing, cooperative, sitting comfortably in no acute distress.   OMT Physical Exam:   Cervical: TTP paraspinal, L3 RRSR Rib: Bilateral elevated first rib with TTP Thoracic: TTP significantly to bilateral paraspinal, T4-7 RRSL     Electronically signed by:  Kayla [name] D.[name] 2:57 PM 10/02/22

## 2022-10-02 ENCOUNTER — Ambulatory Visit (INDEPENDENT_AMBULATORY_CARE_PROVIDER_SITE_OTHER): Payer: PPO | Admitting: Sports Medicine

## 2022-10-02 VITALS — HR 75 | Ht 62.0 in | Wt 132.0 lb

## 2022-10-02 DIAGNOSIS — M9901 Segmental and somatic dysfunction of cervical region: Secondary | ICD-10-CM

## 2022-10-02 DIAGNOSIS — M25562 Pain in left knee: Secondary | ICD-10-CM

## 2022-10-02 DIAGNOSIS — M9908 Segmental and somatic dysfunction of rib cage: Secondary | ICD-10-CM | POA: Diagnosis not present

## 2022-10-02 DIAGNOSIS — M546 Pain in thoracic spine: Secondary | ICD-10-CM | POA: Diagnosis not present

## 2022-10-02 DIAGNOSIS — M542 Cervicalgia: Secondary | ICD-10-CM | POA: Diagnosis not present

## 2022-10-02 DIAGNOSIS — G8929 Other chronic pain: Secondary | ICD-10-CM

## 2022-10-02 DIAGNOSIS — M25561 Pain in right knee: Secondary | ICD-10-CM | POA: Diagnosis not present

## 2022-10-02 DIAGNOSIS — M9902 Segmental and somatic dysfunction of thoracic region: Secondary | ICD-10-CM

## 2022-10-02 NOTE — Patient Instructions (Addendum)
Good to see you  Shoulder HEP 3-4 week follow up    

## 2022-10-05 ENCOUNTER — Ambulatory Visit (INDEPENDENT_AMBULATORY_CARE_PROVIDER_SITE_OTHER): Payer: PPO | Admitting: Orthopaedic Surgery

## 2022-10-05 DIAGNOSIS — M25362 Other instability, left knee: Secondary | ICD-10-CM

## 2022-10-05 DIAGNOSIS — M25361 Other instability, right knee: Secondary | ICD-10-CM | POA: Diagnosis not present

## 2022-10-05 NOTE — Progress Notes (Signed)
The patient is an active and young appearing 72 year old female who is coming to me for an opinion as a relates to both of her knees.  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  About 6 years ago she was exercising and [employer] and had an incident where her left knee sublux of the patella.  It eventually happened to her right knee I believe when she was swimming.  She says it used to occur every so many months and not often but now it is a regular basis were both knees have the patellas feel like they are going to come out of place.  She has never had surgery on either knee.  She did bring with her knee braces that she wears when she is actually taking the [employer] class so she does not have the sensation like her patellas are going to sublux.  She says it does happen on a regular basis even when she is climbing in bed so she a lot of times to wear knee sleeves in bed.  She is not on blood thinning medications.  She is otherwise very happy and active 71 year old female who does appear younger than her stated age.  There are x-rays on the canopy system and MRI of both knees that were performed for [place] to review.  On examination both knees have just slight valgus malalignment but this is very slight.  Both knees have pain along the medial aspect of the patellas and she has a positive apprehension sign on both sides.  She states that is much worse on the left side than the right side.  I did review the plain films and MRI both knees and we looked at the images together.  I did not see any specific tear of the medial patellar retinacular ligament on either knee.  There is thinning of the cartilage on the lateral aspect of each knee but no evidence of full-thickness cartilage loss or subchondral edema.  The ACL and PCL are both intact.  There is no significant meniscal tearing of the lateral meniscus on either knee.  Her symptoms have been quite frustrating for her especially given the fact  that she is so active.  I would like to send her to my partner Dr.Bokshan to have him take a look at her knees to determine whether or not he feels that she would benefit from some type of surgical intervention we could help patella tracking better for her so she would not need to wear the braces and not have the symptoms that she is having.  She understands that he is someone who does perform ligamentous reconstructions of knees but I could not stay what he may consider trying for her knees.  She has of note been through formal physical therapy as well.  All questions concerns were answered and addressed.  Will work on making that referral.  He will be able to see her images on the canopy system as well.

## 2022-10-21 ENCOUNTER — Ambulatory Visit (HOSPITAL_BASED_OUTPATIENT_CLINIC_OR_DEPARTMENT_OTHER): Payer: PPO | Admitting: Orthopaedic Surgery

## 2022-10-21 ENCOUNTER — Encounter (HOSPITAL_BASED_OUTPATIENT_CLINIC_OR_DEPARTMENT_OTHER): Payer: Self-pay | Admitting: Orthopaedic Surgery

## 2022-10-21 DIAGNOSIS — M25362 Other instability, left knee: Secondary | ICD-10-CM | POA: Diagnosis not present

## 2022-10-21 DIAGNOSIS — M25361 Other instability, right knee: Secondary | ICD-10-CM | POA: Diagnosis not present

## 2022-10-21 NOTE — Progress Notes (Signed)
Chief Complaint: Bilateral patellar instability     History of Present Illness:    Kayla [name] is a 72 y.o. female presents with ongoing bilateral patellar instability.  She states that she did have a recent episode on the left for which she has had countless episodes.  These all began in her 58s.  She states that she is felt like the right patella has dislocated at least 5 or 6 times.  She is here today as a referral from Dr. Magnus Ivan.  She does state that she has pain in the knee involving all of the aspects including medially and laterally in addition to the patella instability.  She is very active and has been wearing a compressive type sleeve in order to stabilize the patella.  She states that the left is overall worse than the right.  She has not previously had any physical therapy for this    Surgical History:   none  PMH/PSH/Family History/Social History/Meds/Allergies:   History reviewed. No pertinent past medical history. Past Surgical History:  Procedure Laterality Date   APPENDECTOMY     age 31   FOOT SURGERY Bilateral    TONSILLECTOMY     age 68   Social History   Socioeconomic History   Marital status: Married    Spouse name: Not on file   Number of children: 2   Years of education: Not on file   Highest education level: Not on file  Occupational History   Occupation: Environmental consultant: [employer]  Tobacco Use   Smoking status: Never   Smokeless tobacco: Never  Vaping Use   Vaping Use: Never used  Substance and Sexual Activity   Alcohol use: No   Drug use: No   Sexual activity: Not on file  Other Topics Concern   Not on file  Social History Narrative   Not on file   Social Determinants of Health   Financial Resource Strain: Not on file  Food Insecurity: Not on file  Transportation Needs: Not on file  Physical Activity: Not on file  Stress: Not on file  Social Connections: Not on file    Family History  Problem Relation Age of Onset   Congestive Heart Failure Father    Other Mother        brain tumors-benign   Colon cancer Neg Hx    Allergies  Allergen Reactions   Codeine     "jittery"   Gabapentin Nausea Only, Anxiety and Other (See Comments)    Jittery/hallucinations    Current Outpatient Medications  Medication Sig Dispense Refill   Ascorbic Acid (VITAMIN C PO) Take 1 capsule by mouth daily.     aspirin 81 MG tablet Take 81 mg by mouth daily.     B Complex Vitamins (VITAMIN-B COMPLEX) TABS Take 1 tablet by mouth daily.     Docusate Calcium (STOOL SOFTENER PO) Take 1 capsule by mouth daily.     fish oil-omega-3 fatty acids 1000 MG capsule Take 2 g by mouth daily.     fluticasone (FLONASE) 50 MCG/ACT nasal spray Place into both nostrils daily.     methylPREDNISolone (MEDROL DOSEPAK) 4 MG TBPK tablet Take 6 tablets on day 1.  Take 5 tablets on day 2.  Take 4 tablets on day 3.  Take 3 tablets on day 4.  Take 2 tablets on day 5.  Take 1 tablet on day 6. 21 tablet 0   MULTIPLE VITAMIN PO Take 1 tablet by mouth daily.     PARoxetine (PAXIL) 10 MG tablet Take 10 mg by mouth daily.     Polyethylene Glycol POWD 1 g by Does not apply route daily.     No current facility-administered medications for this visit.   No results found.  Review of Systems:   A ROS was performed including pertinent positives and negatives as documented in the HPI.  Physical Exam :   Constitutional: NAD and appears stated age Neurological: Alert and oriented Psych: Appropriate affect and cooperative There were no vitals taken for this visit.   Comprehensive Musculoskeletal Exam:    Bilateral significant lateral patella apprehension with 3 quadrants of motion bilaterally.  On the left knee and particularly she has medial and lateral joint line pain as well as patellofemoral type pain.  Negative Lachman.  Imaging:   Xray (4 views left knee,  4 views right knee): Normal  MRI (right  knee, left knee): She does have evidence of chondral thinning diffusely particularly on the left involving the lateral joint line with lateral meniscal extrusion  I personally reviewed and interpreted the radiographs.   Assessment:   72 y.o. female with bilateral patellar instability in the setting of arthritis which is more progressive on the MRI as opposed to x-ray.  Her left side is bothering her more than the right.  I did describe that while patellar stabilization procedure may ultimately improve her patella stability and tracking I do not believe this would necessarily decrease her joint line type pain.  With regard to her MRI she does have chondral thinning with some cystic formation and lateral meniscal extrusion on the left.  Given this I do believe that knee arthroplasty would address both her patellar instability as well as her joint line symptoms.  I did discuss that if she were to undergo patella stabilization procedure it is very possible that she would soon after need additional arthroplasty for progression of arthritis particularly given her nonfunctional lateral meniscus in the setting of extrusion  Plan :    -Recommended bracing of bilateral knees to tolerance at this time.     I personally saw and evaluated the patient, and participated in the management and treatment plan.  Huel Cote, MD Attending Physician, Orthopedic Surgery  This document was dictated using Dragon voice recognition software. A reasonable attempt at proof reading has been made to minimize errors.

## 2022-10-29 NOTE — Progress Notes (Signed)
Kayla [name] D.[facility] [address] [place] [address] Phone: [phone]   Assessment and Plan:     1. Chronic bilateral thoracic back pain 2. Chronic bilateral low back pain without sciatica 3. Somatic dysfunction of thoracic region 4. Somatic dysfunction of lumbar region 5. Somatic dysfunction of pelvic region -Chronic with exacerbation, subsequent sports medicine visit - Recurrent multiple musculoskeletal complaints most prominent being in upper back and lower back.  Patient did overall feel benefit with OMT and HEP - Continue HEP and add in low back HEP - Patient has received significant relief with OMT in the past.  Elects for repeat OMT today.  Tolerated well per note below. - Decision today to treat with OMT was based on Physical Exam  After verbal consent patient was treated with HVLA (high velocity low amplitude), ME (muscle energy), FPR (flex positional release), ST (soft tissue), PC/PD (Pelvic Compression/ Pelvic Decompression) techniques in  thoracic, lumbar, and pelvic areas. Patient tolerated the procedure well with improvement in symptoms.  Patient educated on potential side effects of soreness and recommended to rest, hydrate, and use Tylenol as needed for pain control.  6. Chronic pain of both knees 7. Primary osteoarthritis of left knee 8. Primary osteoarthritis of right knee  -Chronic with exacerbation, subsequent visit - Patient was seen by Dr. Magnus Ivan and Dr. Steward Drone for her chronic knee pain.  Discussed with patient that they could try an arthroscopic procedure with patellar stabilization.  Patient is not interested in a procedure at this time - Patient has tried knee braces, however all braces that she has tried have been uncomfortable or too loose - Patient elected for bilateral knee HA injections.  We will order them today and they can be performed once prior authorization has been completed  Pertinent previous  records reviewed include orthopedic surgery note 10/21/2022, orthopedic surgery note 10/05/2022, MRI left knee and right knee 09/08/2022   Follow Up: 1 week for procedure only bilateral knee HA injections   Subjective:   I, Kayla [name], am serving as a Neurosurgeon for Doctor Richardean Sale   Chief Complaint: epidural follow up    HPI:  05/08/21 Patient is a 72 year old female presenting with coccyx pain. Patient was last seen by Dr. Katrinka Blazing on 03/26/21 for this reason and had OMT. Today patient states that she has been doing better since going to the pelvic floor therapist but her lower back has been worse since doing the exercises the therapist gave her to do.    06/02/2021  Patient states that she is doing really well     06/30/2021 Patient states that she threw her back out on Friday thinks it could have been when she was walking to the park with her husband had to a change of terrain. 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 The only other thing is she has been getting bad cramps in both feet and is not sure if that has anything to do with her issues.  Patient had epidural 2 weeks ago.  She states that overall she had decreased pain radiating into her legs as well as decreased sacral pain and decreased tailbone pain.  She thinks that overall it was beneficial.  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   10/01/2021 Patient states that Friday and saturday and Monday she was helping someone pack and move , right thoracic back  left low back are in some pain shoulder upper trap is tight , time for an adjustment    10/30/2021 Patient states last week her knee cap came out of place she had to put it back , has happened in the past and it takes about 3 weeks to return back to normal, tailbone is doing so much better, traveling to [place] in August     01/09/2022 Patient states tail bone is doing better, states she threw her back out week of 4th of July so her low back is in pain    07/15/2022 Patient states she had bilateral subluxations of the knee cap a week or so ago, but only had to use her back brace once while in [place]   08/21/2022 Patient states left knee is in pain , "knee jumped out of place 2 Fridays ago" 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    09/11/2022 Patient states that after she has back to back classes her knee feels unstable , also when she climbs into bed she has pain bilat but more on the left   10/02/2022 Patient states CSI helped left and not right she is going to get a second opinion , she has neck and shoulder pain and upper trap pain , she has to sleep on one side and thinks that's where the tightness and pain are coming from she has pain in her thoracic back pain she has tightness she uses icey-hot  and can't take ibu   10/30/2022 Patient states that she has bilateral shoulder pain and tightness    Relevant Historical Information: LBBB, factor V Leiden  Additional pertinent review of systems negative.   Current Outpatient Medications:    Ascorbic Acid (VITAMIN C PO), Take 1 capsule by mouth daily., Disp: , Rfl:    aspirin 81 MG tablet, Take 81 mg by mouth daily., Disp: , Rfl:    B Complex Vitamins (VITAMIN-B COMPLEX) TABS, Take 1 tablet by mouth daily., Disp: , Rfl:    Docusate Calcium (STOOL SOFTENER PO), Take 1 capsule by mouth daily., Disp: , Rfl:    fish oil-omega-3 fatty acids 1000 MG capsule, Take 2 g by mouth daily., Disp: , Rfl:    fluticasone (FLONASE) 50  MCG/ACT nasal spray, Place into both nostrils daily., Disp: , Rfl:    methylPREDNISolone (MEDROL DOSEPAK) 4 MG TBPK tablet, Take 6 tablets on day 1.  Take 5 tablets on day 2.  Take 4 tablets on day 3.  Take 3 tablets on day 4.  Take 2 tablets on day 5.  Take 1 tablet on day 6., Disp: 21 tablet, Rfl: 0   MULTIPLE VITAMIN PO, Take 1 tablet by mouth daily., Disp: , Rfl:    PARoxetine (PAXIL) 10 MG tablet, Take 10 mg by mouth daily., Disp: , Rfl:    Polyethylene Glycol POWD, 1 g by Does not apply route daily., Disp: , Rfl:    Objective:     Vitals:   10/30/22 1439  Pulse: 94  SpO2: 98%  Weight: 132 lb (59.9 kg)  Height: 5\' 2"  (1.575 m)      Body mass index is 24.14 kg/m.    Physical Exam:    General: Well-appearing, cooperative, sitting comfortably in no acute distress.   OMT Physical Exam:  ASIS Compression Test: Positive Right   Thoracic: TTP paraspinal, increased kyphosis.  Limited rotation Lumbar: TTP paraspinal, L1-3 RRSL Pelvis: Right anterior innominate    Electronically signed by:  Kayla [name] [facility] 2:54 PM 10/30/22

## 2022-10-30 ENCOUNTER — Ambulatory Visit (INDEPENDENT_AMBULATORY_CARE_PROVIDER_SITE_OTHER): Payer: PPO | Admitting: Sports Medicine

## 2022-10-30 VITALS — HR 94 | Ht 62.0 in | Wt 132.0 lb

## 2022-10-30 DIAGNOSIS — M9903 Segmental and somatic dysfunction of lumbar region: Secondary | ICD-10-CM | POA: Diagnosis not present

## 2022-10-30 DIAGNOSIS — M545 Low back pain, unspecified: Secondary | ICD-10-CM

## 2022-10-30 DIAGNOSIS — M9902 Segmental and somatic dysfunction of thoracic region: Secondary | ICD-10-CM

## 2022-10-30 DIAGNOSIS — G8929 Other chronic pain: Secondary | ICD-10-CM

## 2022-10-30 DIAGNOSIS — M546 Pain in thoracic spine: Secondary | ICD-10-CM

## 2022-10-30 DIAGNOSIS — M25561 Pain in right knee: Secondary | ICD-10-CM | POA: Diagnosis not present

## 2022-10-30 DIAGNOSIS — M9905 Segmental and somatic dysfunction of pelvic region: Secondary | ICD-10-CM | POA: Diagnosis not present

## 2022-10-30 DIAGNOSIS — M1712 Unilateral primary osteoarthritis, left knee: Secondary | ICD-10-CM

## 2022-10-30 DIAGNOSIS — M1711 Unilateral primary osteoarthritis, right knee: Secondary | ICD-10-CM

## 2022-10-30 DIAGNOSIS — M25562 Pain in left knee: Secondary | ICD-10-CM

## 2022-10-30 NOTE — Patient Instructions (Addendum)
Low back HEP  1 week follow up

## 2022-11-05 ENCOUNTER — Ambulatory Visit: Payer: PPO | Admitting: Sports Medicine

## 2022-11-09 ENCOUNTER — Ambulatory Visit (INDEPENDENT_AMBULATORY_CARE_PROVIDER_SITE_OTHER): Payer: PPO | Admitting: Sports Medicine

## 2022-11-09 DIAGNOSIS — M1711 Unilateral primary osteoarthritis, right knee: Secondary | ICD-10-CM | POA: Diagnosis not present

## 2022-11-09 DIAGNOSIS — M1712 Unilateral primary osteoarthritis, left knee: Secondary | ICD-10-CM | POA: Diagnosis not present

## 2022-11-09 MED ORDER — HYALURONAN 88 MG/4ML IX SOSY
88.0000 mg | PREFILLED_SYRINGE | Freq: Once | INTRA_ARTICULAR | Status: AC
Start: 2022-11-09 — End: 2022-11-09
  Administered 2022-11-09: 88 mg via INTRA_ARTICULAR

## 2022-11-09 MED ORDER — HYALURONAN 88 MG/4ML IX SOSY
88.0000 mg | PREFILLED_SYRINGE | Freq: Once | INTRA_ARTICULAR | Status: AC
  Administered 2022-11-09: 88 mg via INTRA_ARTICULAR

## 2022-11-09 NOTE — Progress Notes (Signed)
    Aleen Sells D.[facility] [address] [place] [address] Phone: [phone]   Assessment and Plan:     1. Primary osteoarthritis of left knee 2. Primary osteoarthritis of right knee  -Chronic with exacerbation, subsequent visit - Patient presents for procedure only bilateral HA injections with Monovisc.  Tolerated well per note below - Continue HEP and Tylenol as needed  Procedure: Knee Joint Injection Side: Bilateral Indication: Flare of osteoarthritis  Risks explained and consent was given verbally. The site was cleaned with alcohol prep. A needle was introduced with an anterio-lateral approach. Injection given using 4 mL Monovisc 22 mg/ML. This was well tolerated and resulted in symptomatic relief.  Needle was removed, hemostasis achieved, and post injection instructions were explained.  Procedure was repeated on contralateral side.  Pt was advised to call or return to clinic if these symptoms worsen or fail to improve as anticipated.   Pertinent previous records reviewed include none   Follow Up: 4 to 5 weeks for reevaluation   Subjective:    Chief Complaint: Bilateral knee pain  HPI:   11/09/22  Patient presents for procedure only bilateral Monovisc intra-articular knee injections    Additional pertinent review of systems negative.   Current Outpatient Medications:    Ascorbic Acid (VITAMIN C PO), Take 1 capsule by mouth daily., Disp: , Rfl:    aspirin 81 MG tablet, Take 81 mg by mouth daily., Disp: , Rfl:    B Complex Vitamins (VITAMIN-B COMPLEX) TABS, Take 1 tablet by mouth daily., Disp: , Rfl:    Docusate Calcium (STOOL SOFTENER PO), Take 1 capsule by mouth daily., Disp: , Rfl:    fish oil-omega-3 fatty acids 1000 MG capsule, Take 2 g by mouth daily., Disp: , Rfl:    fluticasone (FLONASE) 50 MCG/ACT nasal spray, Place into both nostrils daily., Disp: , Rfl:    methylPREDNISolone (MEDROL DOSEPAK) 4 MG TBPK tablet, Take  6 tablets on day 1.  Take 5 tablets on day 2.  Take 4 tablets on day 3.  Take 3 tablets on day 4.  Take 2 tablets on day 5.  Take 1 tablet on day 6., Disp: 21 tablet, Rfl: 0   MULTIPLE VITAMIN PO, Take 1 tablet by mouth daily., Disp: , Rfl:    PARoxetine (PAXIL) 10 MG tablet, Take 10 mg by mouth daily., Disp: , Rfl:    Polyethylene Glycol POWD, 1 g by Does not apply route daily., Disp: , Rfl:        Electronically signed by:  Aleen Sells [facility] 1:44 PM 11/09/22

## 2022-11-09 NOTE — Progress Notes (Signed)
Procedure only

## 2022-11-20 ENCOUNTER — Other Ambulatory Visit: Payer: Self-pay | Admitting: Internal Medicine

## 2022-11-20 DIAGNOSIS — Z1231 Encounter for screening mammogram for malignant neoplasm of breast: Secondary | ICD-10-CM

## 2022-12-08 NOTE — Progress Notes (Unsigned)
Kayla [name] D.[name] [address] [place] [address] Phone: [phone]   Assessment and Plan:     There are no diagnoses linked to this encounter.  ***   Pertinent previous records reviewed include ***   Follow Up: ***     Subjective:   I, Kayla [name], am serving as a Neurosurgeon for Doctor Richardean Sale   Chief Complaint: epidural follow up    HPI:  05/08/21 Patient is a 72 year old female presenting with coccyx pain. Patient was last seen by Dr. Katrinka Blazing on 03/26/21 for this reason and had OMT. Today patient states that she has been doing better since going to the pelvic floor therapist but her lower back has been worse since doing the exercises the therapist gave her to do.    06/02/2021  Patient states that she is doing really well     06/30/2021 Patient states that she threw her back out on Friday thinks it could have been when she was walking to the park with her husband had to a change of terrain. 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 The only other thing is she has been getting bad cramps in both feet and is not sure if that has anything to do with her issues.  Patient had epidural 2 weeks ago.  She states that overall she had decreased pain radiating into her legs as well as decreased sacral pain and decreased tailbone pain.  She thinks that overall it was beneficial.  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   10/01/2021 Patient states that Friday  and saturday and Monday she was helping someone pack and move , right thoracic back left low back are in some pain shoulder upper trap is tight , time for an adjustment    10/30/2021 Patient states last week her knee cap came out of place she had to put it back , has happened in the past and it takes about 3 weeks to return back to normal, tailbone is doing so much better, traveling to [place] in August     01/09/2022 Patient states tail bone is doing better, states she threw her back out week of 4th of July so her low back is in pain    07/15/2022 Patient states she had bilateral subluxations of the knee cap a week or so ago, but only had to use her back brace once while in [place]   08/21/2022 Patient states left knee is in pain , "knee jumped out of place 2 Fridays ago" 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    09/11/2022 Patient states that after she has back to back classes her knee feels unstable , also when she climbs into bed she has pain bilat but more on the left   10/02/2022 Patient states CSI helped left and not right she is going to get a second opinion , she has  neck and shoulder pain and upper trap pain , she has to sleep on one side and thinks that's where the tightness and pain are coming from she has pain in her thoracic back pain she has tightness she uses icey-hot  and can't take ibu   10/30/2022 Patient states that she has bilateral shoulder pain and tightness   12/09/2022 Patient states    Relevant Historical Information: LBBB, factor V Leiden Additional pertinent review of systems negative.   Current Outpatient Medications:    Ascorbic Acid (VITAMIN C PO), Take 1 capsule by mouth daily., Disp: , Rfl:    aspirin 81 MG tablet, Take 81 mg by mouth daily., Disp: , Rfl:    B Complex Vitamins (VITAMIN-B COMPLEX) TABS, Take 1 tablet by mouth daily., Disp: , Rfl:    Docusate Calcium (STOOL SOFTENER PO), Take 1 capsule by mouth daily., Disp: , Rfl:    fish  oil-omega-3 fatty acids 1000 MG capsule, Take 2 g by mouth daily., Disp: , Rfl:    fluticasone (FLONASE) 50 MCG/ACT nasal spray, Place into both nostrils daily., Disp: , Rfl:    methylPREDNISolone (MEDROL DOSEPAK) 4 MG TBPK tablet, Take 6 tablets on day 1.  Take 5 tablets on day 2.  Take 4 tablets on day 3.  Take 3 tablets on day 4.  Take 2 tablets on day 5.  Take 1 tablet on day 6., Disp: 21 tablet, Rfl: 0   MULTIPLE VITAMIN PO, Take 1 tablet by mouth daily., Disp: , Rfl:    PARoxetine (PAXIL) 10 MG tablet, Take 10 mg by mouth daily., Disp: , Rfl:    Polyethylene Glycol POWD, 1 g by Does not apply route daily., Disp: , Rfl:    Objective:     There were no vitals filed for this visit.    There is no height or weight on file to calculate BMI.    Physical Exam:    ***   Electronically signed by:  Kayla [name] [name] 9:41 AM 12/08/22

## 2022-12-09 ENCOUNTER — Ambulatory Visit (INDEPENDENT_AMBULATORY_CARE_PROVIDER_SITE_OTHER): Payer: PPO | Admitting: Sports Medicine

## 2022-12-09 VITALS — BP 122/82 | HR 103 | Ht 62.0 in | Wt 132.0 lb

## 2022-12-09 DIAGNOSIS — M9903 Segmental and somatic dysfunction of lumbar region: Secondary | ICD-10-CM

## 2022-12-09 DIAGNOSIS — G8929 Other chronic pain: Secondary | ICD-10-CM

## 2022-12-09 DIAGNOSIS — M1712 Unilateral primary osteoarthritis, left knee: Secondary | ICD-10-CM

## 2022-12-09 DIAGNOSIS — M1711 Unilateral primary osteoarthritis, right knee: Secondary | ICD-10-CM | POA: Diagnosis not present

## 2022-12-09 DIAGNOSIS — M545 Low back pain, unspecified: Secondary | ICD-10-CM | POA: Diagnosis not present

## 2022-12-09 DIAGNOSIS — M9905 Segmental and somatic dysfunction of pelvic region: Secondary | ICD-10-CM

## 2022-12-09 DIAGNOSIS — M9902 Segmental and somatic dysfunction of thoracic region: Secondary | ICD-10-CM

## 2022-12-09 NOTE — Patient Instructions (Signed)
4 week follow up  PRP hand out

## 2022-12-14 ENCOUNTER — Ambulatory Visit
Admission: RE | Admit: 2022-12-14 | Discharge: 2022-12-14 | Disposition: A | Discharge status: Home / Self Care | Payer: PPO | Source: Ambulatory Visit | Attending: Internal Medicine | Admitting: Internal Medicine

## 2022-12-14 DIAGNOSIS — Z1231 Encounter for screening mammogram for malignant neoplasm of breast: Secondary | ICD-10-CM

## 2023-01-01 ENCOUNTER — Encounter (HOSPITAL_BASED_OUTPATIENT_CLINIC_OR_DEPARTMENT_OTHER): Payer: Self-pay | Admitting: Orthopaedic Surgery

## 2023-01-05 NOTE — Progress Notes (Unsigned)
Aleen Sells D.[facility] [address] [place] [address] Phone: [phone]   Assessment and Plan:     There are no diagnoses linked to this encounter.  *** - Patient has received relief with OMT in the past.  Elects for repeat OMT today.  Tolerated well per note below. - Decision today to treat with OMT was based on Physical Exam   After verbal consent patient was treated with HVLA (high velocity low amplitude), ME (muscle energy), FPR (flex positional release), ST (soft tissue), PC/PD (Pelvic Compression/ Pelvic Decompression) techniques in cervical, rib, thoracic, lumbar, and pelvic areas. Patient tolerated the procedure well with improvement in symptoms.  Patient educated on potential side effects of soreness and recommended to rest, hydrate, and use Tylenol as needed for pain control.   Pertinent previous records reviewed include ***   Follow Up: ***     Subjective:   I, Dantae Meunier, am serving as a Neurosurgeon for Doctor Richardean Sale   Chief Complaint: epidural follow up    HPI:  05/08/21 Patient is a 72 year old female presenting with coccyx pain. Patient was last seen by Dr. Katrinka Blazing on 03/26/21 for this reason and had OMT. Today patient states that she has been doing better since going to the pelvic floor therapist but her lower back has been worse since doing the exercises the therapist gave her to do.    06/02/2021  Patient states that she is doing really well     06/30/2021 Patient states that she threw her back out on Friday thinks it could have been when she was walking to the park with her husband had to a change of terrain. 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 The only other thing is she has been getting bad cramps in both feet and is not sure if that has anything to do with her issues.  Patient had epidural 2 weeks ago.  She states that overall she had decreased pain radiating into her legs as well as decreased sacral pain and decreased tailbone pain.  She thinks that overall it was beneficial.  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   10/01/2021 Patient states that Friday and saturday and Monday she was helping someone pack and move , right thoracic back left low back are in some pain shoulder upper trap is tight , time for an adjustment    10/30/2021 Patient states last week her knee cap came out of place she had to put it back , has happened in the past and it takes about 3 weeks to return back to normal, tailbone is doing so much better, traveling to [place] in August     01/09/2022 Patient states tail bone is doing better, states she threw her back out week of 4th of July so her low back is in pain    07/15/2022 Patient states she had bilateral subluxations of the knee cap  a week or so ago, but only had to use her back brace once while in [place]   08/21/2022 Patient states left knee is in pain , "knee jumped out of place 2 Fridays ago" 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    09/11/2022 Patient states that 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 Thinks it might be cellulitis. Day after getting her gel shot her knee cap dislocated and she had pain , still wearing sleeves everyday    01/06/2023 Patient states    Relevant Historical Information: LBBB, factor V Leiden  Additional pertinent review of systems negative.  Current Outpatient Medications  Medication Sig Dispense Refill   Ascorbic Acid (VITAMIN C PO) Take 1 capsule by mouth daily.     aspirin 81 MG tablet Take 81 mg by mouth daily.     B Complex Vitamins (VITAMIN-B COMPLEX) TABS Take 1 tablet by mouth daily.     Docusate Calcium (STOOL SOFTENER PO) Take 1 capsule by mouth daily.     fish oil-omega-3 fatty acids 1000 MG capsule Take 2 g by mouth daily.     fluticasone (FLONASE) 50 MCG/ACT nasal spray Place into both nostrils daily.     methylPREDNISolone (MEDROL DOSEPAK) 4 MG TBPK tablet Take 6 tablets on day 1.  Take 5 tablets on day 2.  Take 4 tablets on day 3.  Take 3 tablets on day 4.  Take 2 tablets on day 5.  Take 1 tablet on day 6. 21 tablet 0   MULTIPLE VITAMIN PO Take 1 tablet by mouth daily.     PARoxetine (PAXIL) 10 MG tablet Take 10 mg by mouth daily.     Polyethylene Glycol POWD 1 g by Does not apply route daily.     No current facility-administered medications for this visit.      Objective:     There were no vitals filed for this visit.    There is no height or weight on file to calculate BMI.    Physical Exam:     General: Well-appearing, cooperative, sitting comfortably in no acute distress.   OMT Physical Exam:  ASIS Compression Test: Positive Right Cervical: TTP paraspinal, *** Rib: Bilateral elevated first rib with TTP Thoracic: TTP paraspinal,*** Lumbar: TTP paraspinal,*** Pelvis: Right anterior innominate  Electronically signed  by:  Aleen Sells [facility] 7:16 AM 01/05/23

## 2023-01-06 ENCOUNTER — Ambulatory Visit: Payer: PPO | Admitting: Sports Medicine

## 2023-01-06 VITALS — HR 83 | Ht 62.0 in | Wt 133.0 lb

## 2023-01-06 DIAGNOSIS — M9904 Segmental and somatic dysfunction of sacral region: Secondary | ICD-10-CM

## 2023-01-06 DIAGNOSIS — M9903 Segmental and somatic dysfunction of lumbar region: Secondary | ICD-10-CM

## 2023-01-06 DIAGNOSIS — M9905 Segmental and somatic dysfunction of pelvic region: Secondary | ICD-10-CM | POA: Diagnosis not present

## 2023-01-06 DIAGNOSIS — M545 Low back pain, unspecified: Secondary | ICD-10-CM

## 2023-01-06 DIAGNOSIS — M1712 Unilateral primary osteoarthritis, left knee: Secondary | ICD-10-CM | POA: Diagnosis not present

## 2023-01-06 DIAGNOSIS — G8929 Other chronic pain: Secondary | ICD-10-CM

## 2023-01-11 ENCOUNTER — Ambulatory Visit (INDEPENDENT_AMBULATORY_CARE_PROVIDER_SITE_OTHER): Payer: Self-pay | Admitting: Sports Medicine

## 2023-01-11 DIAGNOSIS — M25562 Pain in left knee: Secondary | ICD-10-CM

## 2023-01-11 DIAGNOSIS — G8929 Other chronic pain: Secondary | ICD-10-CM

## 2023-01-11 DIAGNOSIS — M1712 Unilateral primary osteoarthritis, left knee: Secondary | ICD-10-CM

## 2023-01-11 NOTE — Progress Notes (Signed)
Aleen Sells D.[facility] [address] [place] [address] Phone: [phone]   Assessment and Plan:     1. Primary osteoarthritis of left knee 2. Chronic pain of left knee  -Chronic with exacerbation, subsequent visit - Patient continues to have bilateral knee pain, worse on left with "dislocation" episodes of left patella per patient.  Patient electing for PRP injection at today's visit.  Tolerated well per note below - Patient has previously seen orthopedic surgery, Dr. Magnus Ivan and Dr. Steward Drone.  They discussed with patient a possible arthroscopic procedure with patellar stabilization, however patient is not interested at surgery at this time - I recommend patient start physical therapy. -Recommend relative rest and physical activity as tolerated for 1 to 2 weeks - Recommend no ice or NSAIDs as this could decrease effectiveness of PRP - Start Tylenol 500 to 1000 mg tablets 2-3 times a day for day-to-day pain relief   Procedure: Knee Joint Injection Side: Left Indication: Chronic left knee pain  Risks explained and consent was given verbally. The site was cleaned with alcohol prep. A needle was introduced with an anterio-lateral approach. Injection given using 6mL of PRP. This was well tolerated and resulted in symptomatic relief.  Needle was removed, hemostasis achieved, and post injection instructions were explained.   Pt was advised to call or return to clinic if these symptoms worsen or fail to improve as anticipated.   Pertinent previous records reviewed include none   Follow Up: 4 to 5 weeks for reevaluation.  Could consider repeat OMT for low back pain and discussion of knee pain   Subjective:   I, [name], am serving as a Neurosurgeon for Doctor Richardean Sale   Chief Complaint: epidural follow up    HPI:  05/08/21 Patient is a 72 year old female presenting with coccyx pain. Patient was last seen by Dr. Katrinka Blazing on 03/26/21 for  this reason and had OMT. Today patient states that she has been doing better since going to the pelvic floor therapist but her lower back has been worse since doing the exercises the therapist gave her to do.    06/02/2021  Patient states that she is doing really well     06/30/2021 Patient states that she threw her back out on Friday thinks it could have been when she was walking to the park with her husband had to a change of terrain. 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 The only other thing is she has been getting bad cramps in both feet and is not sure if that has anything to do with her issues.  Patient had epidural 2 weeks ago.  She states that overall she had decreased pain radiating into her legs as well as decreased sacral pain and decreased tailbone pain.  She thinks that overall it was beneficial.  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   10/01/2021 Patient states that Friday and  saturday and Monday she was helping someone pack and move , right thoracic back left low back are in some pain shoulder upper trap is tight , time for an adjustment    10/30/2021 Patient states last week her knee cap came out of place she had to put it back , has happened in the past and it takes about 3 weeks to return back to normal, tailbone is doing so much better, traveling to [place] in August     01/09/2022 Patient states tail bone is doing better, states she threw her back out week of 4th of July so her low back is in pain     07/15/2022 Patient states she had bilateral subluxations of the knee cap a week or so ago, but only had to use her back brace once while in [place]   08/21/2022 Patient states left knee is in pain , "knee jumped out of place 2 Fridays ago" 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    09/11/2022 Patient states that after she has back to back classes her knee feels unstable , also when she climbs into bed she has pain bilat but more on the left   10/02/2022 Patient states CSI helped left and not right she is going to get a second opinion , she has neck and shoulder pain and upper trap pain , she has to sleep on one side and thinks that's where the tightness and pain are coming from she has pain in her thoracic back pain she has tightness she uses icey-hot  and can't take ibu   10/30/2022 Patient states that she has bilateral shoulder pain and tightness    12/09/2022 Patient states her leg is swollen and is wearing a compression sock. Thinks it might be cellulitis. Day after getting her gel shot her knee cap dislocated and she had pain , still wearing sleeves everyday    01/06/2023 Patient states her knee has been giving her some issues. States she had insect feelings crawling up and down her legs... that sensation has resolved    01/11/2023 Patient states she is here for PRP injection.   Relevant Historical Information: LBBB, factor V Leiden Additional pertinent review of systems negative.   Current Outpatient Medications:    Ascorbic Acid (VITAMIN C PO), Take 1 capsule by mouth daily., Disp: , Rfl:    aspirin 81 MG tablet, Take 81 mg by mouth daily., Disp: , Rfl:    B Complex Vitamins (VITAMIN-B COMPLEX) TABS, Take 1 tablet by mouth daily., Disp: , Rfl:    Docusate Calcium (STOOL SOFTENER PO), Take 1 capsule by mouth daily., Disp: , Rfl:    fish oil-omega-3 fatty acids 1000 MG capsule, Take 2 g by mouth daily., Disp: , Rfl:    fluticasone (FLONASE) 50 MCG/ACT nasal spray,  Place into both nostrils daily., Disp: , Rfl:    methylPREDNISolone (MEDROL DOSEPAK) 4 MG TBPK tablet, Take 6 tablets on day 1.  Take 5 tablets on day 2.  Take 4 tablets on day 3.  Take 3 tablets on day 4.  Take 2 tablets on day 5.  Take 1 tablet on day 6., Disp: 21 tablet, Rfl: 0   MULTIPLE VITAMIN PO, Take 1 tablet by mouth daily., Disp: , Rfl:    PARoxetine (PAXIL) 10 MG tablet, Take 10 mg by mouth daily., Disp: , Rfl:    Polyethylene Glycol POWD, 1 g by Does not apply route daily., Disp: , Rfl:  Electronically signed by:  Aleen Sells D.[facility] 9:36 AM 01/11/23

## 2023-01-11 NOTE — Patient Instructions (Signed)
Tylenol [phone] mg 2-3 times a day for pain relief  Pt referral  4-6 week follow up

## 2023-01-25 ENCOUNTER — Ambulatory Visit: Payer: PPO | Attending: Sports Medicine

## 2023-01-25 ENCOUNTER — Other Ambulatory Visit: Payer: Self-pay

## 2023-01-25 DIAGNOSIS — M25562 Pain in left knee: Secondary | ICD-10-CM | POA: Insufficient documentation

## 2023-01-25 DIAGNOSIS — M1712 Unilateral primary osteoarthritis, left knee: Secondary | ICD-10-CM | POA: Diagnosis not present

## 2023-01-25 DIAGNOSIS — M6281 Muscle weakness (generalized): Secondary | ICD-10-CM | POA: Diagnosis present

## 2023-01-25 DIAGNOSIS — G8929 Other chronic pain: Secondary | ICD-10-CM | POA: Insufficient documentation

## 2023-01-25 NOTE — Therapy (Signed)
OUTPATIENT PHYSICAL THERAPY LOWER EXTREMITY EVALUATION   Patient Name: Kayla [name] MRN: [id number] DOB:Mar 04, 1951, 72 y.o., female Today's Date: 01/26/2023  END OF SESSION:  PT End of Session - 01/25/23 1545     Visit Number 1    Number of Visits 17    Date for PT Re-Evaluation 03/27/23    Authorization Type [employer]    PT Start Time 1545    PT Stop Time 1626    PT Time Calculation (min) 41 min    Activity Tolerance Patient tolerated treatment well    Behavior During Therapy [facility] for tasks assessed/performed             History reviewed. No pertinent past medical history. 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 I had an MRI that showed I had bone on bone. He gave me a gel shot this year. The day after this the knee cap jumped out of place again. The gel didn't help. He is now giving me the platelet injection." She reports having PRP injection about 2  weeks ago and thinks that it might be helpful, but is still limited in her activity currently, so is unsure. Patient reports the initial injury occurred during pilates describing a lateral patella dislocation that she relocated herself. She reports since this initial injury she has experienced multiple patella dislocations/subluxations. She was seen by an orthopedic surgeon who discussed a retinaculum surgery, but due to her arthritis was told that a knee replacement would be better suited. She does not want to undergo surgery until absolutely necessary due to an increased risk of clotting due to Factor 5. She was referred to PT in hopes to prevent surgical intervention. She reports both knees will occasionally give out on her and this can be associated with a pop, Lt>Rt. She denies numbness/tingling.   PERTINENT HISTORY: Factor 5 leiden  Bilateral foot surgery  PAIN:  Are you having pain? Yes: NPRS scale: 4 (at worst 10)/10 Pain location: Lt anterior knee Pain description: ache Aggravating factors: sit to stand transfer, side stepping, squatting, prolonged standing  Relieving factors: ice  PRECAUTIONS: None    WEIGHT BEARING RESTRICTIONS: No  FALLS:  Has patient fallen in last 6 months? 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 Small radial tear of the free edge of the anterior horn-body junction of the lateral meniscus. 2. Mild partial-thickness cartilage loss of the medial femorotibial compartment. 3. High-grade partial-thickness cartilage loss of the lateral femorotibial compartment.  PATIENT SURVEYS:  FOTO 66% function to 70%  predicted   COGNITION: Overall cognitive status: Within functional limits for tasks assessed     SENSATION: Not tested  EDEMA:  No obvious swelling about the knees   MUSCLE LENGTH: Hamstrings: WNL bilaterally   POSTURE: mild genu valgum.   PALPATION: Diffuse tenderness about Lt anterior knee   LOWER EXTREMITY ROM:  Active ROM Right eval Left eval  Hip flexion    Hip extension    Hip abduction    Hip adduction    Hip internal rotation    Hip external rotation    Knee flexion  Full   Knee extension  Full   Ankle dorsiflexion 15 12  Ankle plantarflexion    Ankle inversion    Ankle eversion     (Blank rows = not tested)  LOWER EXTREMITY MMT:  MMT Right eval Left eval  Hip flexion 4- 4-  Hip extension 4- 4-   Hip abduction 4-  4-   Hip adduction    Hip internal rotation    Hip external rotation    Knee flexion 5 4+ pn  Knee extension 5 5  Ankle dorsiflexion    Ankle plantarflexion    Ankle inversion    Ankle eversion     (Blank rows = not tested)  LOWER EXTREMITY SPECIAL TESTS:  (-) patellar apprehension  (+) Ober's  FUNCTIONAL TESTS:  Squats: limited depth, decreased knee flexion; increased pain  5 x STS: 11 seconds  SLS: 5 seconds each   GAIT: Distance walked: 10 ft  Assistive device utilized: None Level of assistance: Complete Independence Comments: mild Trendelenburg    [facility] Adult PT Treatment:                                                DATE: 01/25/23 Therapeutic Exercise: Demonstrated and issue initial HEP.    Therapeutic Activity: Education on assessment findings that will be addressed throughout duration of POC.      PATIENT EDUCATION:  Education details: see treatment  Person educated: Patient Education method: Explanation, Demonstration, Tactile cues, Verbal cues, and Handouts Education comprehension: verbalized understanding, returned demonstration, verbal cues required, tactile cues required, and needs further  education  HOME EXERCISE PROGRAM: Access Code: [facility] URL: [email or web address] Date: 01/25/2023 Prepared by: Letitia Libra  Exercises - Sidelying Hip Abduction  - 1 x daily - 7 x weekly - 2 sets - 10 reps - Supine Bridge  - 1 x daily - 7 x weekly - 2 sets - 10 reps - Seated Long Arc Quad  - 1 x daily - 7 x weekly - 2 sets - 10 reps - Supine Active Straight Leg Raise  - 1 x daily - 7 x weekly - 2 sets - 10 reps  ASSESSMENT:  CLINICAL IMPRESSION: Patient is a 72 y.o. female who was seen today for physical therapy evaluation and treatment for chronic Lt knee pain. She describes history of multiple lateral patella dislocations/subluxations in  the past 6-7 years with the first injury occurring during pilates. 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 She has full and painfree ROM about the Lt knee. She is noted to have bilateral hip weakness, mild Lt knee extensor weakness, and aberrant squatting mechanics. She will benefit from skilled PT to address the above stated deficits in order to optimize her function and assist in overall pain reduction.   OBJECTIVE IMPAIRMENTS: Abnormal gait, decreased activity tolerance, decreased balance, decreased knowledge of condition, difficulty walking, decreased strength, impaired flexibility, improper body mechanics, postural dysfunction, and pain.   ACTIVITY LIMITATIONS: bending, squatting, transfers, and locomotion level  PARTICIPATION LIMITATIONS: shopping, community activity, occupation, and yard work  PERSONAL FACTORS: Age, Past/current experiences, Time since onset of injury/illness/exacerbation, and 3+ comorbidities: see PMH/PSH above  are also affecting patient's functional outcome.   REHAB POTENTIAL: Good  CLINICAL DECISION MAKING: Stable/uncomplicated  EVALUATION COMPLEXITY: Low   GOALS: Goals  reviewed with patient? Yes  SHORT TERM GOALS: Target date: 02/22/2023   Patient will be independent and compliant with initial HEP.   Baseline: issued at eval Goal status: INITIAL  2.  Patient will demonstrate proper squat mechanics without an increase in knee pain.  Baseline: see above  Goal status: INITIAL  3.  Patient will report pain at worst rated as </= 6/10 to reduce current functional limitations.  Baseline: 10 Goal status: INITIAL   LONG TERM GOALS: Target date: 03/27/23    Patient will demonstrate 5/5 bilateral hip strength to improve stability about the chain with prolonged standing/walking activity.  Baseline: see above  Goal status: INITIAL  2.  Patient will score at least 70% on FOTO to signify clinically meaningful improvement in functional abilities.   Baseline: see above Goal status: INITIAL  3.  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  Baseline: unable to complete lateral movements (stepping, lunges) Goal status: INITIAL  4.  Patient will be independent with advanced home program to progress/maintain current level of function.  Baseline: initial HEP issued  Goal status: INITIAL    PLAN:  PT FREQUENCY: 1-2x/week  PT DURATION: 8 weeks  PLANNED INTERVENTIONS: Therapeutic exercises, Therapeutic activity, Neuromuscular re-education, Balance training, Gait training, Patient/Family education, Self Care, Joint mobilization, Aquatic Therapy, Cryotherapy, Moist heat, Taping, Vasopneumatic device, Manual therapy, and Re-evaluation  PLAN FOR NEXT SESSION: review and progress HEP prn; hip strengthening; balance; progress to [facility] strengthening as able.   Letitia Libra, PT, DPT, ATC 01/26/23 9:59 AM

## 2023-01-27 ENCOUNTER — Ambulatory Visit: Payer: PPO

## 2023-01-27 DIAGNOSIS — G8929 Other chronic pain: Secondary | ICD-10-CM

## 2023-01-27 DIAGNOSIS — M25562 Pain in left knee: Secondary | ICD-10-CM | POA: Diagnosis not present

## 2023-01-27 DIAGNOSIS — M6281 Muscle weakness (generalized): Secondary | ICD-10-CM

## 2023-01-27 NOTE — Therapy (Signed)
OUTPATIENT PHYSICAL THERAPY LOWER EXTREMITY TREATMENT   Patient Name: Kayla [name] MRN: [id number] DOB:12/23/1950, 72 y.o., female Today's Date: 01/27/2023  END OF SESSION:  PT End of Session - 01/27/23 1535     Visit Number 2    Number of Visits 17    Date for PT Re-Evaluation 03/27/23    Authorization Type [employer]    PT Start Time 1449    PT Stop Time 1532    PT Time Calculation (min) 43 min    Activity Tolerance Patient tolerated treatment well    Behavior During Therapy [facility] for tasks assessed/performed              History reviewed. No pertinent past medical history. 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 She has noticed improvement since doing her HEP.   PERTINENT HISTORY: Factor 5 leiden  Bilateral foot surgery  PAIN:  Are you having pain? Yes: NPRS scale: 4 (at worst 10)/10 Pain location: Lt anterior  knee Pain description: ache Aggravating factors: sit to stand transfer, side stepping, squatting, prolonged standing  Relieving factors: ice  PRECAUTIONS: None    WEIGHT BEARING RESTRICTIONS: No  FALLS:  Has patient fallen in last 6 months? 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 Small radial tear of the free edge of the anterior horn-body junction of the lateral meniscus. 2. Mild partial-thickness cartilage loss of the medial femorotibial compartment. 3. High-grade partial-thickness cartilage loss of the lateral femorotibial compartment.  PATIENT SURVEYS:  FOTO 66% function to 70% predicted   COGNITION: Overall cognitive status: Within functional limits for tasks assessed     SENSATION: Not tested  EDEMA:  No obvious swelling about the knees   MUSCLE LENGTH: Hamstrings: WNL bilaterally   POSTURE: mild genu valgum.   PALPATION: Diffuse tenderness about Lt anterior knee   LOWER EXTREMITY ROM:  Active ROM Right eval Left eval  Hip flexion    Hip extension    Hip abduction    Hip adduction    Hip internal rotation    Hip external rotation  Knee flexion  Full   Knee extension  Full   Ankle dorsiflexion 15 12  Ankle plantarflexion    Ankle inversion    Ankle eversion     (Blank rows = not tested)  LOWER EXTREMITY MMT:  MMT Right eval Left eval  Hip flexion 4- 4-  Hip extension 4- 4-   Hip abduction 4-  4-   Hip adduction    Hip internal rotation    Hip external rotation    Knee flexion 5 4+ pn  Knee extension 5 5  Ankle dorsiflexion    Ankle plantarflexion    Ankle inversion    Ankle  eversion     (Blank rows = not tested)  LOWER EXTREMITY SPECIAL TESTS:  (-) patellar apprehension  (+) Ober's  FUNCTIONAL TESTS:  Squats: limited depth, decreased knee flexion; increased pain  5 x STS: 11 seconds  SLS: 5 seconds each   GAIT: Distance walked: 10 ft  Assistive device utilized: None Level of assistance: Complete Independence Comments: mild Trendelenburg    [facility] Adult PT Treatment:                                                DATE:  01/27/23 Therapeutic Exercise: to improve strength and mobility.  Demo, verbal and tactile cues throughout for technique.  Bike L2x66min SLR 2# 2x10 bil S/L hip abduction 2# 2x10 bil Bridge with blue TB hip ABD 10x5" S/L clamshell GTB 10x3" bil Manual Therapy: to decrease muscle spasm, pain and improve mobility  STM to L medial patellar ligament, VMO, and pes anserine  01/25/23 Therapeutic Exercise: Demonstrated and issue initial HEP.    Therapeutic Activity: Education on assessment findings that will be addressed throughout duration of POC.      PATIENT EDUCATION:  Education details: see treatment  Person educated: Patient Education method: Explanation, Demonstration, Tactile cues, Verbal cues, and Handouts Education comprehension: verbalized understanding, returned demonstration, verbal cues required, tactile cues required, and needs further education  HOME EXERCISE PROGRAM: Access Code: [facility] URL: [email or web address] Date: 01/25/2023 Prepared by: Letitia Libra  Exercises - Sidelying Hip Abduction  - 1 x daily - 7 x weekly - 2 sets - 10 reps - Supine Bridge  - 1 x daily - 7 x weekly - 2 sets - 10 reps - Seated Long Arc Quad  - 1 x daily - 7 x weekly - 2 sets - 10 reps - Supine Active Straight Leg Raise  - 1 x daily - 7 x weekly - 2 sets - 10 reps  ASSESSMENT:  CLINICAL IMPRESSION: Pt tolerated  the progression of the exercises well. Continued to progress hip strengthening as tolerated. Progressed  HEP for hip strengthening in [facility]. Provided cues as needed.  OBJECTIVE IMPAIRMENTS: Abnormal gait, decreased activity tolerance, decreased balance, decreased knowledge of condition, difficulty walking, decreased strength, impaired flexibility, improper body mechanics, postural dysfunction, and pain.   ACTIVITY LIMITATIONS: bending, squatting, transfers, and locomotion level  PARTICIPATION LIMITATIONS: shopping, community activity, occupation, and yard work  PERSONAL FACTORS: Age, Past/current experiences, Time since onset of injury/illness/exacerbation, and 3+ comorbidities: see PMH/PSH above  are also affecting patient's functional outcome.   REHAB POTENTIAL: Good  CLINICAL DECISION MAKING: Stable/uncomplicated  EVALUATION COMPLEXITY: Low   GOALS: Goals reviewed with patient? Yes  SHORT TERM GOALS: Target date: 02/22/2023   Patient will be independent and compliant  with initial HEP.   Baseline: issued at eval Goal status: INITIAL  2.  Patient will demonstrate proper squat mechanics without an increase in knee pain.  Baseline: see above  Goal status: INITIAL  3.  Patient will report pain at worst rated as </= 6/10 to reduce current functional limitations.  Baseline: 10 Goal status: INITIAL   LONG TERM GOALS: Target date: 03/27/23    Patient will demonstrate 5/5 bilateral hip strength to improve stability about the chain with prolonged standing/walking activity.  Baseline: see above  Goal status: INITIAL  2.  Patient will score at least 70% on FOTO to signify clinically meaningful improvement in functional abilities.   Baseline: see above Goal status: INITIAL  3.  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  Baseline: unable to complete lateral movements (stepping, lunges) Goal status: INITIAL  4.  Patient will be independent with advanced home program to progress/maintain current level of function.  Baseline: initial HEP issued   Goal status: INITIAL    PLAN:  PT FREQUENCY: 1-2x/week  PT DURATION: 8 weeks  PLANNED INTERVENTIONS: Therapeutic exercises, Therapeutic activity, Neuromuscular re-education, Balance training, Gait training, Patient/Family education, Self Care, Joint mobilization, Aquatic Therapy, Cryotherapy, Moist heat, Taping, Vasopneumatic device, Manual therapy, and Re-evaluation  PLAN FOR NEXT SESSION: review and progress HEP prn; hip strengthening; balance; progress to [facility] strengthening as able.   Darleene Cleaver, PTA  01/27/23 5:16 PM

## 2023-02-03 ENCOUNTER — Ambulatory Visit: Payer: PPO

## 2023-02-03 DIAGNOSIS — M6281 Muscle weakness (generalized): Secondary | ICD-10-CM

## 2023-02-03 DIAGNOSIS — G8929 Other chronic pain: Secondary | ICD-10-CM

## 2023-02-03 DIAGNOSIS — M25562 Pain in left knee: Secondary | ICD-10-CM | POA: Diagnosis not present

## 2023-02-03 NOTE — Therapy (Signed)
OUTPATIENT PHYSICAL THERAPY LOWER EXTREMITY TREATMENT   Patient Name: Kayla [name] MRN: [id number] DOB:[birth year], 72 y.o., female Today's Date: 02/03/2023  END OF SESSION:  PT End of Session - 02/03/23 1452     Visit Number 3    Number of Visits 17    Date for PT Re-Evaluation 03/27/23    Authorization Type [employer]    PT Start Time 1446    PT Stop Time 1542    PT Time Calculation (min) 56 min    Activity Tolerance Patient tolerated treatment well    Behavior During Therapy [facility] for tasks assessed/performed              History reviewed. No pertinent past medical history. 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 02/04/2015   Symptomatic menopausal or female climacteric states 03/10/2014    PCP: Cheron Schaumann, MD   REFERRING PROVIDER:   Richardean Sale, DO    REFERRING DIAG:  [phone] (ICD-10-CM) - Primary osteoarthritis of left knee  M25.562,G89.29 (ICD-10-CM) - Chronic pain of left knee    THERAPY DIAG:  Chronic pain of left knee  Muscle weakness (generalized)  Rationale for Evaluation and Treatment: Rehabilitation  ONSET DATE: chronic   SUBJECTIVE:   SUBJECTIVE STATEMENT: 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  PERTINENT HISTORY: Factor 5 leiden  Bilateral foot surgery  PAIN:  Are you having pain? Yes: NPRS scale: 4 (at worst 10)/10 Pain  location: Lt anterior knee Pain description: ache Aggravating factors: sit to stand transfer, side stepping, squatting, prolonged standing  Relieving factors: ice  PRECAUTIONS: None    WEIGHT BEARING RESTRICTIONS: No  FALLS:  Has patient fallen in last 6 months? 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 Small radial tear of the free edge of the anterior horn-body junction of the lateral meniscus. 2. Mild partial-thickness cartilage loss of the medial femorotibial compartment. 3. High-grade partial-thickness cartilage loss of the lateral femorotibial compartment.  PATIENT SURVEYS:  FOTO 66% function to 70% predicted   COGNITION: Overall cognitive status: Within functional limits for tasks assessed     SENSATION: Not tested  EDEMA:  No obvious swelling about the knees   MUSCLE LENGTH: Hamstrings: WNL bilaterally   POSTURE: mild genu valgum.   PALPATION: Diffuse tenderness about Lt anterior knee   LOWER EXTREMITY ROM:  Active ROM Right eval Left eval  Hip flexion    Hip extension    Hip abduction    Hip adduction    Hip internal rotation    Hip external rotation  Knee flexion  Full   Knee extension  Full   Ankle dorsiflexion 15 12  Ankle plantarflexion    Ankle inversion    Ankle eversion     (Blank rows = not tested)  LOWER EXTREMITY MMT:  MMT Right eval Left eval  Hip flexion 4- 4-  Hip extension 4- 4-   Hip abduction 4-  4-   Hip adduction    Hip internal rotation    Hip external rotation    Knee flexion 5 4+ pn  Knee extension 5 5  Ankle dorsiflexion    Ankle plantarflexion    Ankle  inversion    Ankle eversion     (Blank rows = not tested)  LOWER EXTREMITY SPECIAL TESTS:  (-) patellar apprehension  (+) Ober's  FUNCTIONAL TESTS:  Squats: limited depth, decreased knee flexion; increased pain  5 x STS: 11 seconds  SLS: 5 seconds each   GAIT: Distance walked: 10 ft  Assistive device utilized: None Level of assistance: Complete Independence Comments: mild Trendelenburg    [facility] Adult PT Treatment:                                                DATE:  02/03/23 Therapeutic Exercise: to improve strength and mobility.  Demo, verbal and tactile cues throughout for technique.  Bike L3x61min Standing TKE with ball on wall 10x5" Toe raises back to wall with ball behind knee for feedback 2x10  Retro step R/L with ipsilateral arm raise x 12 each Hip hikes RLE with GTB TKE on 4' step x 15 with 1 hand support Mini lunges x 10 R/L  Manual Therapy: to decrease muscle spasm, pain and improve mobility  IASTM to L medial patellar ligament, quads  01/27/23 Therapeutic Exercise: to improve strength and mobility.  Demo, verbal and tactile cues throughout for technique.  Bike L2x60min SLR 2# 2x10 bil S/L hip abduction 2# 2x10 bil Bridge with blue TB hip ABD 10x5" S/L clamshell GTB 10x3" bil Manual Therapy: to decrease muscle spasm, pain and improve mobility  STM to L medial patellar ligament, VMO, and pes anserine  01/25/23 Therapeutic Exercise: Demonstrated and issue initial HEP.    Therapeutic Activity: Education on assessment findings that will be addressed throughout duration of POC.      PATIENT EDUCATION:  Education details: HEP Person educated: Patient Education method: Solicitor, Actor cues, Verbal cues, and Handouts Education comprehension: verbalized understanding, returned demonstration, verbal cues required, tactile cues required, and needs further education  HOME EXERCISE PROGRAM: Access Code: [facility] URL:  [email or web address] Date: 02/03/2023 Prepared by: Verta Ellen  Exercises - Sidelying Hip Abduction  - 1 x daily - 7 x weekly - 2 sets - 10 reps - Seated Long Arc Quad  - 1 x daily - 7 x weekly - 2 sets - 10 reps - Supine Active Straight Leg Raise  - 1 x daily - 7 x weekly - 2 sets - 10 reps - Clamshell with Resistance  - 1 x daily - 7 x weekly - 2 sets - 10 reps - Bridge with Hip Abduction and Resistance  - 1 x daily - 7 x weekly - 2 sets - 10 reps - Toe Raise With Back Against Wall  - 1 x daily - 3 x weekly - 3 sets - 10 reps - Retro Step  - 1 x  daily - 3 x weekly - 3 sets - 10 reps - Standing Terminal Knee Extension at Wall with Ball  - 1 x daily - 3 x weekly - 3 sets - 10 reps  ASSESSMENT:  CLINICAL IMPRESSION: Pt reports a couple of instances where her L knee buckled on her within the past week. Focused exercises primarily on quad strength in weight bearing to build stability and decrease instance of knee buckling. Cues were needed to avoid knees over toes with lunges. Cues also required to avoid knee flexion during hip hiking. Throughout session she noted mild pain along the medial knee joint line. Manual therapy done to L quads and medial knee to address pain and muscle tension. Education given on self STM to address quads. HEP updates also provided.  OBJECTIVE IMPAIRMENTS: Abnormal gait, decreased activity tolerance, decreased balance, decreased knowledge of condition, difficulty walking, decreased strength, impaired flexibility, improper body mechanics, postural dysfunction, and pain.   ACTIVITY LIMITATIONS: bending, squatting, transfers, and locomotion level  PARTICIPATION LIMITATIONS: shopping, community activity, occupation, and yard work  PERSONAL FACTORS: Age, Past/current experiences, Time since onset of injury/illness/exacerbation, and 3+ comorbidities: see PMH/PSH above  are also affecting patient's functional outcome.   REHAB POTENTIAL: Good  CLINICAL  DECISION MAKING: Stable/uncomplicated  EVALUATION COMPLEXITY: Low   GOALS: Goals reviewed with patient? Yes  SHORT TERM GOALS: Target date: 02/22/2023   Patient will be independent and compliant with initial HEP.   Baseline: issued at eval Goal status: MET- 02/03/23  2.  Patient will demonstrate proper squat mechanics without an increase in knee pain.  Baseline: see above  Goal status: INITIAL  3.  Patient will report pain at worst rated as </= 6/10 to reduce current functional limitations.  Baseline: 10 Goal status: INITIAL   LONG TERM GOALS: Target date: 03/27/23    Patient will demonstrate 5/5 bilateral hip strength to improve stability about the chain with prolonged standing/walking activity.  Baseline: see above  Goal status: INITIAL  2.  Patient will score at least 70% on FOTO to signify clinically meaningful improvement in functional abilities.   Baseline: see above Goal status: INITIAL  3.  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  Baseline: unable to complete lateral movements (stepping, lunges) Goal status: INITIAL  4.  Patient will be independent with advanced home program to progress/maintain current level of function.  Baseline: initial HEP issued  Goal status: INITIAL    PLAN:  PT FREQUENCY: 1-2x/week  PT DURATION: 8 weeks  PLANNED INTERVENTIONS: Therapeutic exercises, Therapeutic activity, Neuromuscular re-education, Balance training, Gait training, Patient/Family education, Self Care, Joint mobilization, Aquatic Therapy, Cryotherapy, Moist heat, Taping, Vasopneumatic device, Manual therapy, and Re-evaluation  PLAN FOR NEXT SESSION: review and progress HEP (coming 1x per week); work on quad stability in [employer] and hip strength  Darleene Cleaver, PTA  02/03/23 4:02 PM

## 2023-02-10 ENCOUNTER — Ambulatory Visit: Payer: PPO

## 2023-02-10 DIAGNOSIS — G8929 Other chronic pain: Secondary | ICD-10-CM

## 2023-02-10 DIAGNOSIS — M25562 Pain in left knee: Secondary | ICD-10-CM | POA: Diagnosis not present

## 2023-02-10 DIAGNOSIS — M6281 Muscle weakness (generalized): Secondary | ICD-10-CM

## 2023-02-10 NOTE — Therapy (Signed)
OUTPATIENT PHYSICAL THERAPY LOWER EXTREMITY TREATMENT   Patient Name: Kayla [name] MRN: [id number] DOB:07-Nov-1950, 72 y.o., female Today's Date: 02/10/2023  END OF SESSION:     History reviewed. No pertinent past medical history. 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  PERTINENT HISTORY: Factor 5 leiden  Bilateral foot surgery  PAIN:  Are you having pain? Yes: NPRS scale: 7 /10 Pain location: Lt anterior knee Pain description: ache Aggravating factors: sit to stand transfer, side stepping, squatting, prolonged standing  Relieving factors: ice  PRECAUTIONS: None    WEIGHT BEARING RESTRICTIONS: No  FALLS:  Has patient fallen in last 6 months? 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 Small radial tear of the free edge of the anterior horn-body junction of the lateral meniscus. 2. Mild partial-thickness cartilage loss of the medial femorotibial compartment. 3. High-grade partial-thickness cartilage loss of the lateral femorotibial compartment.  PATIENT SURVEYS:  FOTO 66% function to 70% predicted   COGNITION: Overall cognitive status: Within functional limits for tasks assessed     SENSATION: Not tested  EDEMA:  No obvious swelling about the knees   MUSCLE LENGTH: Hamstrings: WNL bilaterally   POSTURE: mild genu valgum.   PALPATION: Diffuse tenderness about Lt anterior knee   LOWER EXTREMITY ROM:  Active ROM Right eval Left eval  Hip flexion    Hip extension    Hip abduction    Hip adduction    Hip internal rotation    Hip external rotation    Knee flexion  Full   Knee extension  Full   Ankle dorsiflexion 15 12  Ankle plantarflexion    Ankle inversion    Ankle eversion     (Blank rows = not tested)  LOWER EXTREMITY MMT:  MMT Right eval Left eval  Hip flexion 4- 4-  Hip extension 4- 4-   Hip abduction 4-  4-   Hip adduction    Hip internal rotation    Hip external rotation    Knee flexion 5 4+ pn  Knee extension 5 5  Ankle dorsiflexion    Ankle plantarflexion  Ankle inversion    Ankle eversion     (Blank rows = not tested)  LOWER EXTREMITY SPECIAL TESTS:  (-) patellar apprehension  (+) Ober's  FUNCTIONAL TESTS:  Squats: limited depth, decreased knee flexion; increased pain  5 x STS: 11 seconds  SLS: 5 seconds each   GAIT: Distance walked: 10 ft  Assistive device utilized: None Level of assistance: Complete Independence Comments: mild Trendelenburg    [facility] Adult PT Treatment:                                                DATE:   02/10/23 Therapeutic Exercise: to improve strength and mobility.  Demo, verbal and tactile cues throughout for technique. Nuetsp L5x9min Seated knee extension GTB x 10  Mini squats counter x 10   Manual Therapy: to decrease muscle spasm, pain and improve mobility  STM to L quads and hamstrings 02/03/23 Therapeutic Exercise: to improve strength and mobility.  Demo, verbal and tactile cues throughout for technique.  Bike L3x54min Standing TKE with ball on wall 10x5" Toe raises back to wall with ball behind knee for feedback 2x10  Retro step R/L with ipsilateral arm raise x 12 each Hip hikes RLE with GTB TKE on 4' step x 15 with 1 hand support Mini lunges x 10 R/L  Manual Therapy: to decrease muscle spasm, pain and improve mobility  IASTM to L medial patellar ligament, quads  01/27/23 Therapeutic Exercise: to improve strength and mobility.  Demo, verbal and tactile cues throughout for technique.  Bike L2x3min SLR 2# 2x10 bil S/L hip abduction 2# 2x10 bil Bridge with blue TB hip ABD 10x5" S/L clamshell GTB 10x3" bil Manual Therapy: to decrease muscle spasm, pain and improve mobility  STM to L medial patellar ligament, VMO, and pes anserine  01/25/23 Therapeutic Exercise: Demonstrated and issue initial HEP.    Therapeutic Activity: Education on assessment findings that will be addressed throughout duration of POC.      PATIENT EDUCATION:  Education details: HEP Person educated: Patient Education method: Solicitor, Actor cues, Verbal cues, and Handouts Education comprehension: verbalized understanding, returned demonstration, verbal cues required, tactile cues required, and needs further education  HOME EXERCISE PROGRAM: Access Code: [facility] URL: [email or web address] Date: 02/03/2023 Prepared by: Verta Ellen  Exercises - Sidelying Hip Abduction  - 1 x daily - 7 x weekly - 2 sets - 10 reps - Seated Long Arc Quad  - 1 x daily - 7 x weekly  - 2 sets - 10 reps - Supine Active Straight Leg Raise  - 1 x daily - 7 x weekly - 2 sets - 10 reps - Clamshell with Resistance  - 1 x daily - 7 x weekly - 2 sets - 10 reps - Bridge with Hip Abduction and Resistance  - 1 x daily - 7 x weekly - 2 sets - 10 reps - Toe Raise With Back Against Wall  - 1 x daily - 3 x weekly - 3 sets - 10 reps - Retro Step  - 1 x daily - 3 x weekly - 3 sets - 10 reps - Standing Terminal Knee Extension at Wall with Ball  - 1 x daily - 3 x weekly - 3 sets - 10 reps  ASSESSMENT:  CLINICAL IMPRESSION: Jalaina continues to note instances of knee giving out on her during her [employer]  classes. Found muscle tension in both quads and hamstrings today during manual. Afterwards worked on quad focused strengthening in [facility], which did well for her. Cues were given to avoid full knee extension to avoid increased knee pain. Her pain went from 7/10 to 4/10 post session. She does not have knee pain with squats, so has met STG #2.  OBJECTIVE IMPAIRMENTS: Abnormal gait, decreased activity tolerance, decreased balance, decreased knowledge of condition, difficulty walking, decreased strength, impaired flexibility, improper body mechanics, postural dysfunction, and pain.   ACTIVITY LIMITATIONS: bending, squatting, transfers, and locomotion level  PARTICIPATION LIMITATIONS: shopping, community activity, occupation, and yard work  PERSONAL FACTORS: Age, Past/current experiences, Time since onset of injury/illness/exacerbation, and 3+ comorbidities: see PMH/PSH above  are also affecting patient's functional outcome.   REHAB POTENTIAL: Good  CLINICAL DECISION MAKING: Stable/uncomplicated  EVALUATION COMPLEXITY: Low   GOALS: Goals reviewed with patient? Yes  SHORT TERM GOALS: Target date: 02/22/2023   Patient will be independent and compliant with initial HEP.   Baseline: issued at eval Goal status: MET- 02/03/23  2.  Patient will demonstrate proper squat mechanics without an  increase in knee pain.  Baseline: see above  Goal status: 02/10/23- MET  3.  Patient will report pain at worst rated as </= 6/10 to reduce current functional limitations.  Baseline: 10 Goal status: INITIAL   LONG TERM GOALS: Target date: 03/27/23    Patient will demonstrate 5/5 bilateral hip strength to improve stability about the chain with prolonged standing/walking activity.  Baseline: see above  Goal status: INITIAL  2.  Patient will score at least 70% on FOTO to signify clinically meaningful improvement in functional abilities.   Baseline: see above Goal status: INITIAL  3.  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  Baseline: unable to complete lateral movements (stepping, lunges) Goal status: INITIAL  4.  Patient will be independent with advanced home program to progress/maintain current level of function.  Baseline: initial HEP issued  Goal status: INITIAL    PLAN:  PT FREQUENCY: 1-2x/week  PT DURATION: 8 weeks  PLANNED INTERVENTIONS: Therapeutic exercises, Therapeutic activity, Neuromuscular re-education, Balance training, Gait training, Patient/Family education, Self Care, Joint mobilization, Aquatic Therapy, Cryotherapy, Moist heat, Taping, Vasopneumatic device, Manual therapy, and Re-evaluation  PLAN FOR NEXT SESSION: review and progress HEP (coming 1x per week); work on quad strengthening; MT to address tightness in quads and hamstrings  Talma Aguillard L Mabeline Varas, PTA  02/10/23 4:30 PM

## 2023-02-16 NOTE — Progress Notes (Unsigned)
Kayla [name] D.[facility] [address] [place] [address] Phone: [phone]   Assessment and Plan:     There are no diagnoses linked to this encounter.  ***   Pertinent previous records reviewed include ***   Follow Up: ***     Subjective:   I, Brice Kossman, am serving as a Neurosurgeon for Doctor Richardean Sale   Chief Complaint: epidural follow up    HPI:  05/08/21 Patient is a 72 year old female presenting with coccyx pain. Patient was last seen by Dr. Katrinka Blazing on 03/26/21 for this reason and had OMT. Today patient states that she has been doing better since going to the pelvic floor therapist but her lower back has been worse since doing the exercises the therapist gave her to do.    06/02/2021  Patient states that she is doing really well     06/30/2021 Patient states that she threw her back out on Friday thinks it could have been when she was walking to the park with her husband had to a change of terrain. 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 The only other thing is she has been getting bad cramps in both feet and is not sure if that has anything to do with her issues.  Patient had epidural 2 weeks ago.  She states that overall she had decreased pain radiating into her legs as well as decreased sacral pain and decreased tailbone pain.  She thinks that overall it was beneficial.  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   10/01/2021 Patient states that Friday  and saturday and Monday she was helping someone pack and move , right thoracic back left low back are in some pain shoulder upper trap is tight , time for an adjustment    10/30/2021 Patient states last week her knee cap came out of place she had to put it back , has happened in the past and it takes about 3 weeks to return back to normal, tailbone is doing so much better, traveling to [place] in August     01/09/2022 Patient states tail bone is doing better, states she threw her back out week of 4th of July so her low back is in pain    07/15/2022 Patient states she had bilateral subluxations of the knee cap a week or so ago, but only had to use her back brace once while in [place]   08/21/2022 Patient states left knee is in pain , "knee jumped out of place 2 Fridays ago" 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    09/11/2022 Patient states that after she has back to back classes her knee feels unstable , also when she climbs into bed she has pain bilat but more on the left   10/02/2022 Patient states CSI helped left and not right she is going to get a second opinion , she has  neck and shoulder pain and upper trap pain , she has to sleep on one side and thinks that's where the tightness and pain are coming from she has pain in her thoracic back pain she has tightness she uses icey-hot  and can't take ibu   10/30/2022 Patient states that she has bilateral shoulder pain and tightness    12/09/2022 Patient states her leg is swollen and is wearing a compression sock. Thinks it might be cellulitis. Day after getting her gel shot her knee cap dislocated and she had pain , still wearing sleeves everyday    01/06/2023 Patient states her knee has been giving her some issues. States she had insect feelings crawling up and down her legs... that sensation has resolved    01/11/2023 Patient states she is here for PRP injection.  02/17/2023 Patient states   Relevant Historical  Information: LBBB, factor V Leiden  Additional pertinent review of systems negative.   Current Outpatient Medications:    Ascorbic Acid (VITAMIN C PO), Take 1 capsule by mouth daily., Disp: , Rfl:    aspirin 81 MG tablet, Take 81 mg by mouth daily., Disp: , Rfl:    B Complex Vitamins (VITAMIN-B COMPLEX) TABS, Take 1 tablet by mouth daily., Disp: , Rfl:    Docusate Calcium (STOOL SOFTENER PO), Take 1 capsule by mouth daily., Disp: , Rfl:    fish oil-omega-3 fatty acids 1000 MG capsule, Take 2 g by mouth daily., Disp: , Rfl:    fluticasone (FLONASE) 50 MCG/ACT nasal spray, Place into both nostrils daily., Disp: , Rfl:    methylPREDNISolone (MEDROL DOSEPAK) 4 MG TBPK tablet, Take 6 tablets on day 1.  Take 5 tablets on day 2.  Take 4 tablets on day 3.  Take 3 tablets on day 4.  Take 2 tablets on day 5.  Take 1 tablet on day 6. (Patient not taking: Reported on 01/25/2023), Disp: 21 tablet, Rfl: 0   MULTIPLE VITAMIN PO, Take 1 tablet by mouth daily., Disp: , Rfl:    PARoxetine (PAXIL) 10 MG tablet, Take 10 mg by mouth daily., Disp: , Rfl:    Polyethylene Glycol POWD, 1 g by Does not apply route daily., Disp: , Rfl:    Objective:     There were no vitals filed for this visit.    There is no height or weight on file to calculate BMI.    Physical Exam:    ***   Electronically signed by:  Kayla [name] [facility] 12:00 PM 02/16/23

## 2023-02-17 ENCOUNTER — Ambulatory Visit: Payer: PPO | Admitting: Sports Medicine

## 2023-02-17 VITALS — HR 64 | Ht 62.0 in | Wt 134.0 lb

## 2023-02-17 DIAGNOSIS — M1712 Unilateral primary osteoarthritis, left knee: Secondary | ICD-10-CM

## 2023-02-17 DIAGNOSIS — M25562 Pain in left knee: Secondary | ICD-10-CM | POA: Diagnosis not present

## 2023-02-17 DIAGNOSIS — G8929 Other chronic pain: Secondary | ICD-10-CM | POA: Diagnosis not present

## 2023-02-24 ENCOUNTER — Encounter: Payer: Self-pay | Admitting: Physical Therapy

## 2023-02-24 ENCOUNTER — Ambulatory Visit: Payer: PPO | Attending: Sports Medicine | Admitting: Physical Therapy

## 2023-02-24 DIAGNOSIS — M62838 Other muscle spasm: Secondary | ICD-10-CM

## 2023-02-24 DIAGNOSIS — G8929 Other chronic pain: Secondary | ICD-10-CM

## 2023-02-24 DIAGNOSIS — M25562 Pain in left knee: Secondary | ICD-10-CM | POA: Insufficient documentation

## 2023-02-24 DIAGNOSIS — M6281 Muscle weakness (generalized): Secondary | ICD-10-CM

## 2023-02-24 NOTE — Therapy (Addendum)
OUTPATIENT PHYSICAL THERAPY LOWER EXTREMITY TREATMENT/Discharge Summary   Patient Name: Kayla [name] MRN: [id number] DOB:February 10, 1951, 72 y.o., female Today's Date: 02/24/2023  END OF SESSION:  PT End of Session - 02/24/23 1537     Visit Number 5    Number of Visits 17    Date for PT Re-Evaluation 03/27/23    Authorization Type [employer]    PT Start Time 1535    PT Stop Time 1625    PT Time Calculation (min) 50 min    Activity Tolerance Patient tolerated treatment well    Behavior During Therapy [facility] for tasks assessed/performed               History reviewed. No pertinent past medical history. 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 Jean Rosenthal says she can stop coming to therapy so this may be her last visit.  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  She doesn't have pain  with exercises so does have pain afterwards, he advised her to avoid things like the grapevine.  She brought her HEP to review today.   L knee buckled today.    PERTINENT HISTORY: Factor 5 leiden  Bilateral foot surgery  PAIN:  Are you having pain? Yes: NPRS scale: 0/10 Pain location: Lt anterior knee Pain description: ache Aggravating factors: sit to stand transfer, side stepping, squatting, prolonged standing  Relieving factors: ice  PRECAUTIONS: None    WEIGHT BEARING RESTRICTIONS: No  FALLS:  Has patient fallen in last 6 months? 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 Small radial tear of the free edge of the anterior horn-body junction of the lateral meniscus. 2. Mild partial-thickness cartilage loss of the medial femorotibial compartment. 3. High-grade partial-thickness cartilage loss of the lateral femorotibial compartment.  PATIENT SURVEYS:  FOTO 66% function to 70% predicted   COGNITION: Overall cognitive status: Within functional limits for tasks assessed     SENSATION: Not tested  EDEMA:  No obvious swelling about the knees   MUSCLE LENGTH: Hamstrings: WNL bilaterally   POSTURE:  mild genu valgum.   PALPATION: Diffuse tenderness about Lt anterior knee   LOWER EXTREMITY ROM:  Active ROM Right eval Left eval  Hip flexion    Hip extension    Hip abduction    Hip adduction    Hip internal rotation    Hip external rotation    Knee flexion  Full   Knee extension  Full   Ankle dorsiflexion 15 12  Ankle plantarflexion    Ankle inversion    Ankle eversion     (Blank rows = not tested)  LOWER  EXTREMITY MMT:  MMT Right eval Left eval  Hip flexion 4- 4-  Hip extension 4- 4-   Hip abduction 4-  4-   Hip adduction    Hip internal rotation    Hip external rotation    Knee flexion 5 4+ pn  Knee extension 5 5  Ankle dorsiflexion    Ankle plantarflexion    Ankle inversion    Ankle eversion     (Blank rows = not tested)  LOWER EXTREMITY SPECIAL TESTS:  (-) patellar apprehension  (+) Ober's  FUNCTIONAL TESTS:  Squats: limited depth, decreased knee flexion; increased pain  5 x STS: 11 seconds  SLS: 5 seconds each   GAIT: Distance walked: 10 ft  Assistive device utilized: None Level of assistance: Complete Independence Comments: mild Trendelenburg    [facility] Adult PT Treatment:                                                DATE:   02/24/2023  Therapeutic Exercise: to improve strength and mobility.  Demo, verbal and tactile cues throughout for technique.  Bike L1 x 5 min  Review of all her HEP from current episode, prior episodes of PT and HEPs given after PRP.  Consolidated into HEP below.  Discussed exercises and how to do without pain (avoid open chain with weight on ankle, place on thigh instead), and reviewed form for silver sneakers to avoid twisting knee.    02/10/23 Therapeutic Exercise: to improve strength and mobility.  Demo, verbal and tactile cues throughout for technique. Nustep L5x84min Seated knee extension GTB x 10  Mini squats counter x 10   Manual Therapy: to decrease muscle spasm, pain and improve mobility  STM to L quads and hamstrings 02/03/23 Therapeutic Exercise: to improve strength and mobility.  Demo, verbal and tactile cues throughout for technique.  Bike L3x37min Standing TKE with ball on wall 10x5" Toe raises back to wall with ball behind knee for feedback 2x10  Retro step R/L with ipsilateral arm raise x 12 each Hip hikes RLE with GTB TKE on 4' step x 15 with 1 hand support Mini lunges x 10 R/L  Manual Therapy: to decrease muscle spasm,  pain and improve mobility  IASTM to L medial patellar ligament, quads  01/27/23 Therapeutic Exercise: to improve strength and mobility.  Demo, verbal and tactile cues throughout for technique.  Bike L2x14min SLR 2# 2x10 bil S/L hip abduction 2# 2x10 bil Bridge with blue TB hip ABD 10x5" S/L clamshell GTB 10x3" bil Manual Therapy: to decrease muscle spasm, pain and improve mobility  STM to L medial patellar ligament, VMO, and pes anserine  01/25/23 Therapeutic Exercise: Demonstrated and issue initial HEP.    Therapeutic Activity: Education on assessment findings that will be addressed throughout duration of  POC.      PATIENT EDUCATION:  Education details: HEP Person educated: Patient Education method: Programmer, multimedia, Demonstration, Actor cues, Verbal cues, and Handouts Education comprehension: verbalized understanding, returned demonstration, verbal cues required, tactile cues required, and needs further education  HOME EXERCISE PROGRAM: Access Code: [facility] URL: [email or web address] Date: 02/24/2023 Prepared by: Harrie Foreman  Exercises - Seated Long Arc Quad with Ankle Weight  - 1 x daily - 7 x weekly - 2 sets - 10 reps - Clamshell with Resistance  - 1 x daily - 7 x weekly - 2 sets - 10 reps - Bridge with Hip Abduction and Resistance  - 1 x daily - 7 x weekly - 2 sets - 10 reps - Supine Hip Flexion with Ankle Weight  - 1 x daily - 7 x weekly - 2 sets - 10 reps - Sidelying Hip Abduction with Resistance at Thighs  - 1 x daily - 7 x weekly - 2 sets - 10 reps - Standing Hip Abduction with Counter Support  - 1 x daily - 7 x weekly - 2 sets - 10 reps - Squat With [employer] Between Knees  - 1 x daily - 7 x weekly - 2 sets - 10 reps - Seated Hamstring Stretch  - 1 x daily - 7 x weekly - 1 sets - 3 reps - 30 sec hold  ASSESSMENT:  CLINICAL IMPRESSION: Tyesha Joffe Wainwright reports that she has been released by MD.  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  She arrived with about 4 different sets of hand outs, reviewed exercises from all the hand outs and which exercises she felt worked her muscles, which exercises caused pain, etc, to consolidate her exercises into 1 manageable handout.  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  Also reviewed safety with exercise due to history of patellar subluxation and reinforced avoiding twisting movements of knee.  She elected for 30 day hold today based on progress.    OBJECTIVE IMPAIRMENTS: Abnormal gait, decreased activity tolerance, decreased balance, decreased knowledge of condition, difficulty walking, decreased strength, impaired flexibility, improper body mechanics, postural dysfunction, and pain.   ACTIVITY LIMITATIONS: bending, squatting, transfers, and locomotion level  PARTICIPATION LIMITATIONS: shopping, community activity, occupation, and yard work  PERSONAL FACTORS: Age, Past/current experiences, Time since onset of injury/illness/exacerbation, and 3+ comorbidities: see PMH/PSH above  are also affecting patient's functional outcome.   REHAB POTENTIAL: Good  CLINICAL DECISION MAKING: Stable/uncomplicated  EVALUATION COMPLEXITY: Low   GOALS: Goals reviewed with patient? Yes  SHORT TERM GOALS: Target date: 02/22/2023   Patient will be independent and compliant with initial HEP.   Baseline: issued at eval Goal status: MET- 02/03/23  2.  Patient will demonstrate proper squat mechanics without an increase in knee pain.  Baseline: see above  Goal status: 02/10/23- MET  3.  Patient will report pain at worst rated as </= 6/10 to reduce current functional limitations.  Baseline: 10 Goal status: MET 02/24/23 - 4-5/10 after exercise.     LONG TERM GOALS: Target date: 03/27/23    Patient will demonstrate  5/5 bilateral hip strength to improve stability about the chain with prolonged standing/walking activity.  Baseline: see above  Goal status: IN PROGRESS  2.  Patient will score at least 70% on FOTO to signify clinically meaningful improvement  in functional abilities.   Baseline: see above Goal status: IN PROGRESS  3.  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  Baseline: unable to complete lateral movements (stepping, lunges) Goal status: IN PROGRESS 02/24/23 able to do all movements with exception of twisting movements per MD  4.  Patient will be independent with advanced home program to progress/maintain current level of function.  Baseline: initial HEP issued  Goal status: MET 02/24/23 reviewed all exercises and consolidated    PLAN:  PT FREQUENCY: 1-2x/week  PT DURATION: 8 weeks  PLANNED INTERVENTIONS: Therapeutic exercises, Therapeutic activity, Neuromuscular re-education, Balance training, Gait training, Patient/Family education, Self Care, Joint mobilization, Aquatic Therapy, Cryotherapy, Moist heat, Taping, Vasopneumatic device, Manual therapy, and Re-evaluation  PLAN FOR NEXT SESSION: 30 day hold.   Jena Gauss, PT, DPT 02/24/23 4:38 PM  PHYSICAL THERAPY DISCHARGE SUMMARY  Visits from Start of Care: 5  Current functional level related to goals / functional outcomes: Significantly improved Left knee pain   Remaining deficits: Intermittent buckling    Education / Equipment: HEP  Plan: Patient agrees to discharge.  Patient was placed on hold for 30 days on 02/24/23 by request and has not needed to return to PT, therefore will proceed with discharge from PT for this episode.     Jena Gauss, PT, DPT 03/31/2023 8:15 AM

## 2023-03-03 ENCOUNTER — Ambulatory Visit: Payer: PPO

## 2023-03-10 ENCOUNTER — Encounter: Payer: PPO | Admitting: Physical Therapy

## 2023-06-25 ENCOUNTER — Encounter (INDEPENDENT_AMBULATORY_CARE_PROVIDER_SITE_OTHER): Payer: PPO | Admitting: Ophthalmology

## 2023-06-25 DIAGNOSIS — H43813 Vitreous degeneration, bilateral: Secondary | ICD-10-CM

## 2023-06-25 DIAGNOSIS — H33302 Unspecified retinal break, left eye: Secondary | ICD-10-CM

## 2023-06-25 DIAGNOSIS — H348321 Tributary (branch) retinal vein occlusion, left eye, with retinal neovascularization: Secondary | ICD-10-CM | POA: Diagnosis not present

## 2023-09-24 ENCOUNTER — Other Ambulatory Visit: Payer: Self-pay

## 2023-09-24 ENCOUNTER — Encounter (HOSPITAL_BASED_OUTPATIENT_CLINIC_OR_DEPARTMENT_OTHER): Payer: Self-pay | Admitting: Urology

## 2023-09-24 ENCOUNTER — Emergency Department (HOSPITAL_BASED_OUTPATIENT_CLINIC_OR_DEPARTMENT_OTHER)
Admission: EM | Admit: 2023-09-24 | Discharge: 2023-09-24 | Disposition: A | Discharge status: Home / Self Care | Attending: Emergency Medicine | Admitting: Emergency Medicine

## 2023-09-24 DIAGNOSIS — R55 Syncope and collapse: Secondary | ICD-10-CM | POA: Diagnosis present

## 2023-09-24 DIAGNOSIS — E876 Hypokalemia: Secondary | ICD-10-CM | POA: Insufficient documentation

## 2023-09-24 LAB — BASIC METABOLIC PANEL WITH GFR
Anion gap: 10 (ref 5–15)
BUN: 24 mg/dL — ABNORMAL HIGH (ref 8–23)
CO2: 26 mmol/L (ref 22–32)
Calcium: 8.9 mg/dL (ref 8.9–10.3)
Chloride: 103 mmol/L (ref 98–111)
Creatinine, Ser: 0.85 mg/dL (ref 0.44–1.00)
GFR, Estimated: >60 mL/min (ref >60)
Glucose, Bld: 120 mg/dL — ABNORMAL HIGH (ref 70–99)
Potassium: 3.2 mmol/L — ABNORMAL LOW (ref 3.5–5.1)
Sodium: 139 mmol/L (ref 135–145)

## 2023-09-24 LAB — HEPATIC FUNCTION PANEL
ALT: 43 U/L (ref 0–44)
AST: 39 U/L (ref 15–41)
Albumin: 4 g/dL (ref 3.5–5.0)
Alkaline Phosphatase: 40 U/L (ref 38–126)
Bilirubin, Direct: 0.2 mg/dL (ref 0.0–0.2)
Indirect Bilirubin: 0.5 mg/dL (ref 0.3–0.9)
Total Bilirubin: 0.7 mg/dL (ref 0.0–1.2)
Total Protein: 6.8 g/dL (ref 6.5–8.1)

## 2023-09-24 LAB — CBC
HCT: 41.1 % (ref 36.0–46.0)
Hemoglobin: 14.3 g/dL (ref 12.0–15.0)
MCH: 32.9 pg (ref 26.0–34.0)
MCHC: 34.8 g/dL (ref 30.0–36.0)
MCV: 94.5 fL (ref 80.0–100.0)
Platelets: 212 10*3/uL (ref 150–400)
RBC: 4.35 MIL/uL (ref 3.87–5.11)
RDW: 12.1 % (ref 11.5–15.5)
WBC: 6.9 10*3/uL (ref 4.0–10.5)
nRBC: 0 % (ref 0.0–0.2)

## 2023-09-24 LAB — LIPASE, BLOOD: Lipase: 28 U/L (ref 11–51)

## 2023-09-24 LAB — TROPONIN I (HIGH SENSITIVITY): Troponin I (High Sensitivity): 4 ng/L (ref <18)

## 2023-09-24 MED ORDER — POTASSIUM CHLORIDE CRYS ER 20 MEQ PO TBCR: 40.0000 meq | EXTENDED_RELEASE_TABLET | Freq: Every day | ORAL | 0 refills | Status: DC

## 2023-09-24 MED ORDER — SODIUM CHLORIDE 0.9 % IV BOLUS
1000.0000 mL | Freq: Once | INTRAVENOUS | Status: AC
  Administered 2023-09-24: 1000 mL via INTRAVENOUS

## 2023-09-24 NOTE — ED Provider Notes (Signed)
 Emergency Department Provider Note   I have reviewed the triage vital signs and the nursing notes.   HISTORY  Chief Complaint Loss of Consciousness   HPI Kayla [name] is a 73 y.o. female presents to the emergency department for evaluation of syncope this morning.  She was evaluated by EMS who ultimately advised she present to the ED for evaluation and presents here by private vehicle.  She denies any palpitations, chest pain.  She did have headache along with nausea and vomiting over the past week.  She had gotten back to taking p.o. and went to an exercise class which was very hot and she felt very lightheaded.  She did not fully pass out but had to be helped to the ground. No fall.   History reviewed. No pertinent past medical history.  Review of Systems  Constitutional: No fever/chills Cardiovascular: Denies chest pain. Positive syncope.  Respiratory: Denies shortness of breath. Gastrointestinal: No abdominal pain. Positive nausea, vomiting, and diarrhea.  Genitourinary: Negative for dysuria. Musculoskeletal: Negative for back pain. Skin: Negative for rash. Neurological: Negative for headaches. ____________________________________________   PHYSICAL EXAM:  VITAL SIGNS: ED Triage Vitals  Encounter Vitals Group     BP 09/24/23 1335 (!) 156/96     Pulse Rate 09/24/23 1335 86     Resp 09/24/23 1335 18     Temp 09/24/23 1335 98.4 F (36.9 C)     Temp src --      SpO2 09/24/23 1335 98 %     Weight 09/24/23 1333 134 lb 2.4 oz (60.9 kg)     Height 09/24/23 1333 5\' 2"  (1.575 m)   Constitutional: Alert and oriented. Well appearing and in no acute distress. Eyes: Conjunctivae are normal.  Head: Atraumatic. Nose: No congestion/rhinnorhea. Mouth/Throat: Mucous membranes are moist.   Neck: No stridor.  Cardiovascular: Normal rate, regular rhythm. Good peripheral circulation. Grossly normal heart sounds.   Respiratory: Normal respiratory effort.  No retractions.  Lungs CTAB. Gastrointestinal: Soft and nontender. No distention.  Musculoskeletal: No lower extremity tenderness nor edema. No gross deformities of extremities. Neurologic:  Normal speech and language. No gross focal neurologic deficits are appreciated.  Skin:  Skin is warm, dry and intact. No rash noted.  ____________________________________________   LABS (all labs ordered are listed, but only abnormal results are displayed)  Labs Reviewed  BASIC METABOLIC PANEL WITH GFR - Abnormal; Notable for the following components:      Result Value   Potassium 3.2 (*)    Glucose, Bld 120 (*)    BUN 24 (*)    All other components within normal limits  CBC  HEPATIC FUNCTION PANEL  LIPASE, BLOOD  TROPONIN I (HIGH SENSITIVITY)   ____________________________________________  EKG   EKG Interpretation Date/Time:  Friday September 24 2023 13:39:28 EDT Ventricular Rate:  75 PR Interval:  152 QRS Duration:  138 QT Interval:  418 QTC Calculation: 466 R Axis:   -28  Text Interpretation: Normal sinus rhythm Non-specific intra-ventricular conduction block Minimal voltage criteria for LVH, may be normal variant ( Cornell product ) Artifact Confirmed by Gerhard Munch [phone]) on 09/26/2023 9:53:59 AM       ____________________________________________   PROCEDURES  Procedure(s) performed:   Procedures  None  ____________________________________________   INITIAL IMPRESSION / ASSESSMENT AND PLAN / ED COURSE  Pertinent labs & imaging results that were available during my care of the patient were reviewed by me and considered in my medical decision making (see chart for details).  This patient is Presenting for Evaluation of syncope, which does require a range of treatment options, and is a complaint that involves a high risk of morbidity and mortality.  The Differential Diagnoses include dehydration, AKI, electrolyte disturbance, hypokalemia, etc.  Critical Interventions-     Medications  sodium chloride 0.9 % bolus 1,000 mL (0 mLs Intravenous Stopped 09/24/23 1511)    Reassessment after intervention: symptoms improved.    Clinical Laboratory Tests Ordered, included troponin normal.  Mild hypokalemia 3.2.  No acute kidney injury.  No severe anemia.  Cardiac Monitor Tracing which shows NSR.    Social Determinants of Health Risk patient is a non-smoker.   Medical Decision Making: Summary:  Patient presents emergency department with near syncope in the setting of recent GI losses with return to physical activity.  No chest pain or other obvious red flag signs or symptoms.  Plan for IV fluids, screening blood work, reassess.  Reevaluation with update and discussion with patient.  She is doing much better after IV fluids.  No ectopy or other arrhythmia on telemetry. Stable for discharge and PCP follow up.   Considered admission but workup is reassuring. Will place referral for outpatient Cardiology.   Patient's presentation is most consistent with acute presentation with potential threat to life or bodily function.   Disposition: discharge  ____________________________________________  FINAL CLINICAL IMPRESSION(S) / ED DIAGNOSES  Final diagnoses:  Near syncope  Hypokalemia     NEW OUTPATIENT MEDICATIONS STARTED DURING THIS VISIT:  Discharge Medication List as of 09/24/2023  3:11 PM     START taking these medications   Details  potassium chloride SA (KLOR-CON M) 20 MEQ tablet Take 2 tablets (40 mEq total) by mouth daily for 4 days., Starting Fri 09/24/2023, Until Tue 09/28/2023, Normal        Note:  This document was prepared using Dragon voice recognition software and may include unintentional dictation errors.  Alona Bene, MD, [facility] Emergency Medicine    Tsuneo Faison, Arlyss Repress, MD 09/27/23 1336

## 2023-09-24 NOTE — Discharge Instructions (Signed)

## 2023-09-24 NOTE — ED Notes (Addendum)
 Pt IV fluids are still infusing, d/c paperwork reviewed and pt voices understanding.  Will d/c IV once infusion completed.

## 2023-09-24 NOTE — ED Triage Notes (Signed)
 Pt states had syncopal episode this am and EMS came and evaluated  Sent here for repeat EKG and work up  Reports Headache, N/V that started Sunday but is now getting better, tolerating po intake now     H/o mitral valve prolapse

## 2023-09-27 ENCOUNTER — Emergency Department (HOSPITAL_BASED_OUTPATIENT_CLINIC_OR_DEPARTMENT_OTHER)

## 2023-09-27 ENCOUNTER — Other Ambulatory Visit: Payer: Self-pay

## 2023-09-27 ENCOUNTER — Emergency Department (HOSPITAL_BASED_OUTPATIENT_CLINIC_OR_DEPARTMENT_OTHER)
Admission: EM | Admit: 2023-09-27 | Discharge: 2023-09-27 | Discharge status: Against Medical Advice | Attending: Emergency Medicine | Admitting: Emergency Medicine

## 2023-09-27 ENCOUNTER — Encounter (HOSPITAL_BASED_OUTPATIENT_CLINIC_OR_DEPARTMENT_OTHER): Payer: Self-pay | Admitting: Emergency Medicine

## 2023-09-27 DIAGNOSIS — K5730 Diverticulosis of large intestine without perforation or abscess without bleeding: Secondary | ICD-10-CM | POA: Insufficient documentation

## 2023-09-27 DIAGNOSIS — K828 Other specified diseases of gallbladder: Secondary | ICD-10-CM | POA: Diagnosis not present

## 2023-09-27 DIAGNOSIS — K760 Fatty (change of) liver, not elsewhere classified: Secondary | ICD-10-CM | POA: Diagnosis not present

## 2023-09-27 DIAGNOSIS — R55 Syncope and collapse: Secondary | ICD-10-CM | POA: Diagnosis present

## 2023-09-27 DIAGNOSIS — R42 Dizziness and giddiness: Secondary | ICD-10-CM | POA: Insufficient documentation

## 2023-09-27 LAB — COMPREHENSIVE METABOLIC PANEL WITH GFR
ALT: 35 U/L (ref 0–44)
AST: 23 U/L (ref 15–41)
Albumin: 3.7 g/dL (ref 3.5–5.0)
Alkaline Phosphatase: 35 U/L — ABNORMAL LOW (ref 38–126)
Anion gap: 8 (ref 5–15)
BUN: 15 mg/dL (ref 8–23)
CO2: 23 mmol/L (ref 22–32)
Calcium: 8.7 mg/dL — ABNORMAL LOW (ref 8.9–10.3)
Chloride: 108 mmol/L (ref 98–111)
Creatinine, Ser: 0.7 mg/dL (ref 0.44–1.00)
GFR, Estimated: >60 mL/min (ref >60)
Glucose, Bld: 130 mg/dL — ABNORMAL HIGH (ref 70–99)
Potassium: 3.8 mmol/L (ref 3.5–5.1)
Sodium: 139 mmol/L (ref 135–145)
Total Bilirubin: 0.8 mg/dL (ref 0.0–1.2)
Total Protein: 6.2 g/dL — ABNORMAL LOW (ref 6.5–8.1)

## 2023-09-27 LAB — TROPONIN I (HIGH SENSITIVITY): Troponin I (High Sensitivity): 2 ng/L (ref <18)

## 2023-09-27 LAB — URINALYSIS, ROUTINE W REFLEX MICROSCOPIC
Bilirubin Urine: NEGATIVE
Glucose, UA: NEGATIVE mg/dL
Hgb urine dipstick: NEGATIVE
Ketones, ur: NEGATIVE mg/dL
Leukocytes,Ua: NEGATIVE
Nitrite: NEGATIVE
Protein, ur: NEGATIVE mg/dL
Specific Gravity, Urine: 1.025 (ref 1.005–1.030)
pH: 5.5 (ref 5.0–8.0)

## 2023-09-27 LAB — CBC WITH DIFFERENTIAL/PLATELET
Abs Immature Granulocytes: 0.01 10*3/uL (ref 0.00–0.07)
Basophils Absolute: 0 10*3/uL (ref 0.0–0.1)
Basophils Relative: 0 %
Eosinophils Absolute: 0.5 10*3/uL (ref 0.0–0.5)
Eosinophils Relative: 8 %
HCT: 41.1 % (ref 36.0–46.0)
Hemoglobin: 14.5 g/dL (ref 12.0–15.0)
Immature Granulocytes: 0 %
Lymphocytes Relative: 27 %
Lymphs Abs: 1.5 10*3/uL (ref 0.7–4.0)
MCH: 33 pg (ref 26.0–34.0)
MCHC: 35.3 g/dL (ref 30.0–36.0)
MCV: 93.6 fL (ref 80.0–100.0)
Monocytes Absolute: 0.4 10*3/uL (ref 0.1–1.0)
Monocytes Relative: 6 %
Neutro Abs: 3.4 10*3/uL (ref 1.7–7.7)
Neutrophils Relative %: 59 %
Platelets: 224 10*3/uL (ref 150–400)
RBC: 4.39 MIL/uL (ref 3.87–5.11)
RDW: 12.2 % (ref 11.5–15.5)
WBC: 5.8 10*3/uL (ref 4.0–10.5)
nRBC: 0 % (ref 0.0–0.2)

## 2023-09-27 LAB — CBG MONITORING, ED: Glucose-Capillary: 149 mg/dL — ABNORMAL HIGH (ref 70–99)

## 2023-09-27 LAB — LIPASE, BLOOD: Lipase: 29 U/L (ref 11–51)

## 2023-09-27 MED ORDER — IOHEXOL 300 MG/ML  SOLN
100.0000 mL | Freq: Once | INTRAMUSCULAR | Status: AC | PRN
  Administered 2023-09-27: 100 mL via INTRAVENOUS

## 2023-09-27 MED ORDER — SODIUM CHLORIDE 0.9 % IV BOLUS
500.0000 mL | Freq: Once | INTRAVENOUS | Status: AC
  Administered 2023-09-27: 500 mL via INTRAVENOUS

## 2023-09-27 NOTE — Progress Notes (Signed)
 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 A week ago had viral gastrointesetinal infection (n/v/d). Seen 3 days ago for near syncope. Hypokalemia. Discahrge. Had syncope today again. No premonitory symptoms. No lab abnormality. Tele ok. CT a/p normal.  ______________________________________________________________  Labs on Admission:  Results for orders placed or performed during the hospital encounter of 09/27/23 (from the past 24 hours)  CBG monitoring, ED     Status: Abnormal   Collection Time: 09/27/23  1:21 PM  Result Value Ref Range   Glucose-Capillary 149 (H) 70 - 99 mg/dL  Urinalysis, Routine w reflex microscopic -Urine, Clean Catch     Status: None   Collection Time: 09/27/23  1:44 PM  Result Value Ref Range   Color, Urine YELLOW YELLOW   APPearance CLEAR CLEAR   Specific Gravity, Urine 1.025 1.005 - 1.030   pH 5.5 5.0 - 8.0   Glucose, UA NEGATIVE NEGATIVE mg/dL   Hgb urine dipstick NEGATIVE NEGATIVE   Bilirubin Urine NEGATIVE NEGATIVE   Ketones, ur NEGATIVE NEGATIVE mg/dL   Protein, ur NEGATIVE NEGATIVE mg/dL   Nitrite NEGATIVE NEGATIVE   Leukocytes,Ua NEGATIVE NEGATIVE  Comprehensive metabolic panel     Status: Abnormal   Collection Time: 09/27/23  2:02 PM  Result Value Ref Range   Sodium 139 135 - 145 mmol/L   Potassium 3.8 3.5 - 5.1 mmol/L   Chloride 108 98 - 111 mmol/L   CO2 23 22 - 32 mmol/L   Glucose, Bld 130 (H) 70 - 99 mg/dL   BUN 15 8 - 23 mg/dL   Creatinine, Ser 4.54 0.44 - 1.00 mg/dL   Calcium 8.7 (L) 8.9 - 10.3 mg/dL   Total Protein 6.2 (L) 6.5 - 8.1 g/dL   Albumin 3.7 3.5 - 5.0 g/dL   AST 23 15 - 41 U/L   ALT 35 0 - 44 U/L   Alkaline Phosphatase 35 (L) 38 - 126 U/L   Total Bilirubin 0.8 0.0 - 1.2 mg/dL   GFR, Estimated >09 >81 mL/min   Anion gap 8 5 - 15  Lipase, blood     Status: None   Collection Time: 09/27/23  2:02 PM  Result Value Ref Range   Lipase 29 11 - 51 U/L  CBC with  Differential     Status: None   Collection Time: 09/27/23  2:02 PM  Result Value Ref Range   WBC 5.8 4.0 - 10.5 K/uL   RBC 4.39 3.87 - 5.11 MIL/uL   Hemoglobin 14.5 12.0 - 15.0 g/dL   HCT 19.1 47.8 - 29.5 %   MCV 93.6 80.0 - 100.0 fL   MCH 33.0 26.0 - 34.0 pg   MCHC 35.3 30.0 - 36.0 g/dL   RDW 62.1 30.8 - 65.7 %   Platelets 224 150 - 400 K/uL   nRBC 0.0 0.0 - 0.2 %   Neutrophils Relative % 59 %   Neutro Abs 3.4 1.7 - 7.7 K/uL   Lymphocytes Relative 27 %   Lymphs Abs 1.5 0.7 - 4.0 K/uL   Monocytes Relative 6 %   Monocytes Absolute 0.4 0.1 - 1.0 K/uL   Eosinophils Relative 8 %   Eosinophils Absolute 0.5 0.0 - 0.5 K/uL   Basophils Relative 0 %   Basophils Absolute 0.0 0.0 - 0.1 K/uL   Immature Granulocytes 0 %   Abs Immature Granulocytes 0.01 0.00 - 0.07 K/uL  Troponin I (High Sensitivity)     Status: None  Collection Time: 09/27/23  2:02 PM  Result Value Ref Range   Troponin I (High Sensitivity) 2 <18 ng/L   Basic Metabolic Panel: Recent Labs  Lab 09/24/23 1341 09/27/23 1402  NA 139 139  K 3.2* 3.8  CL 103 108  CO2 26 23  GLUCOSE 120* 130*  BUN 24* 15  CREATININE 0.85 0.70  CALCIUM 8.9 8.7*   Liver Function Tests: Recent Labs  Lab 09/24/23 1341 09/27/23 1402  AST 39 23  ALT 43 35  ALKPHOS 40 35*  BILITOT 0.7 0.8  PROT 6.8 6.2*  ALBUMIN 4.0 3.7   Recent Labs  Lab 09/24/23 1341 09/27/23 1402  LIPASE 28 29   No results for input(s): "AMMONIA" in the last 168 hours. CBC: Recent Labs  Lab 09/24/23 1341 09/27/23 1402  WBC 6.9 5.8  NEUTROABS  --  3.4  HGB 14.3 14.5  HCT 41.1 41.1  MCV 94.5 93.6  PLT 212 224   Cardiac Enzymes: Recent Labs  Lab 09/24/23 1346 09/27/23 1402  TROPONINIHS 4 2    BNP (last 3 results) No results for input(s): "PROBNP" in the last 8760 hours. CBG: Recent Labs  Lab 09/27/23 1321  GLUCAP 149*    Radiological Exams on Admission:  DG Chest 2 View Result Date: 09/27/2023 CLINICAL DATA:  Syncope.  Dizziness.  EXAM: CHEST - 2 VIEW COMPARISON:  None Available. FINDINGS: The heart is normal in size.The cardiomediastinal contours are normal. Minimal biapical pleuroparenchymal scarring. Pulmonary vasculature is normal. No consolidation, pleural effusion, or pneumothorax. No acute osseous abnormalities are seen. IMPRESSION: No active cardiopulmonary disease. Electronically Signed   By: Narda Rutherford M.D.   On: 09/27/2023 17:57   CT ABDOMEN PELVIS W CONTRAST Result Date: 09/27/2023 CLINICAL DATA:  Abdominal pain.  Tenderness.  Recent syncope EXAM: CT ABDOMEN AND PELVIS WITH CONTRAST TECHNIQUE: Multidetector CT imaging of the abdomen and pelvis was performed using the standard protocol following bolus administration of intravenous contrast. RADIATION DOSE REDUCTION: This exam was performed according to the departmental dose-optimization program which includes automated exposure control, adjustment of the mA and/or kV according to patient size and/or use of iterative reconstruction technique. CONTRAST:  OMNIPAQUE IOHEXOL 300 MG/ML  SOLN COMPARISON:  None Available. FINDINGS: Lower chest: There is some linear opacity lung bases likely scar or atelectasis. No pleural effusion. Small air cyst right lung base on image 12 series 5. Hepatobiliary: Mild fatty liver infiltration. Focal fat deposition as well seen adjacent to the falciform ligament in segment 4 of the liver. Multiple benign appendix cystic lesions are identified. Largest is seen in segment 4 measuring 19 mm. Gallbladder is distended. Patent portal vein. Pancreas: Unremarkable. No pancreatic ductal dilatation or surrounding inflammatory changes. Spleen: Normal in size without focal abnormality. Adrenals/Urinary Tract: Adrenal glands are unremarkable. Kidneys are normal, without renal calculi, focal lesion, or hydronephrosis. Bladder is unremarkable. Stomach/Bowel: On this non oral contrast exam, the stomach is nondilated. Small bowel has a normal course and  caliber. Large bowel has some scattered colonic stool. Few sigmoid colon diverticula. Redundant course of the transverse colon extending into the low pelvis. The appendix is not clearly seen in the right lower quadrant but no pericecal stranding or fluid. Vascular/Lymphatic: Aortic atherosclerosis. No enlarged abdominal or pelvic lymph nodes. Reproductive: Uterus and bilateral adnexa are unremarkable. Other: No free air or free fluid. Small fat containing umbilical hernia. Musculoskeletal: Mild degenerative changes of the spine and pelvis. Trace anterolisthesis of L4 on L5. IMPRESSION: No bowel obstruction, free air or  free fluid. Scattered stool. The appendix is poorly seen in the right lower quadrant but no pericecal stranding or fluid. Few colonic diverticula Fatty liver infiltration. Distended gallbladder. If there is concern of acute gallbladder pathology, ultrasound could be performed as clinically appropriate Electronically Signed   By: Karen Kays M.D.   On: 09/27/2023 17:22    chest X-ray    No intake/output data recorded. Total I/O In: 500.7 [IV Piggyback:500.7] Out: -    ________________________  Will admit to tele for syncope

## 2023-09-27 NOTE — ED Triage Notes (Signed)
 Rec[place] at [place]  , was seen on 4/4 for same . Persistent Dizziness x 1 week , alert and oriented x 4 , ambulatory with steady gait

## 2023-09-27 NOTE — ED Provider Notes (Signed)
 Emergency Department Provider Note   I have reviewed the triage vital signs and the nursing notes.   HISTORY  Chief Complaint Dizziness   HPI Kayla [name] is a 73 y.o. female presents to the emergency department for evaluation of.  She was seen on 4/7 for syncope as well.  At the time she was having GI symptoms and volume loss from that.  Her Verline Lema have stopped and she returned to normal eating and activity.  She was at church yesterday when she suddenly felt herself passing out.  Her husband caught her and was able to assist her outside.  She did not present to the ED or other medical treatment until she saw her PCP this morning who referred her back to the emergency department.  She denies any chest pain or heart palpitations.  She states her PCP also noticed some tenderness in her abdomen but she does not feel discomfort other than when pressing.  She last had a bowel movement this morning.  History reviewed. No pertinent past medical history.  Review of Systems  Constitutional: No fever/chills Cardiovascular: Denies chest pain. Positive syncope.  Respiratory: Denies shortness of breath. Gastrointestinal: No abdominal pain. No vomiting.  No diarrhea.  No constipation. Skin: Negative for rash. Neurological: Negative for headaches. ____________________________________________   PHYSICAL EXAM:  VITAL SIGNS: ED Triage Vitals  Encounter Vitals Group     BP 09/27/23 1320 (!) 149/89     Pulse Rate 09/27/23 1320 97     Resp 09/27/23 1320 16     Temp 09/27/23 1320 98.2 F (36.8 C)     Temp Source 09/27/23 1320 Oral     SpO2 09/27/23 1320 100 %   Constitutional: Alert and oriented. Well appearing and in no acute distress. Eyes: Conjunctivae are normal.  Head: Atraumatic. Nose: No congestion/rhinnorhea. Mouth/Throat: Mucous membranes are moist.   Neck: No stridor.   Cardiovascular: Normal rate, regular rhythm. Good peripheral circulation. Grossly normal heart sounds.    Respiratory: Normal respiratory effort.  No retractions. Lungs CTAB. Gastrointestinal: Soft and nontender. No distention.  Musculoskeletal: No lower extremity tenderness nor edema. No gross deformities of extremities. Neurologic:  Normal speech and language.  Skin:  Skin is warm, dry and intact. No rash noted.  ____________________________________________   LABS (all labs ordered are listed, but only abnormal results are displayed)  Labs Reviewed  COMPREHENSIVE METABOLIC PANEL WITH GFR - Abnormal; Notable for the following components:      Result Value   Glucose, Bld 130 (*)    Calcium 8.7 (*)    Total Protein 6.2 (*)    Alkaline Phosphatase 35 (*)    All other components within normal limits  CBG MONITORING, ED - Abnormal; Notable for the following components:   Glucose-Capillary 149 (*)    All other components within normal limits  LIPASE, BLOOD  CBC WITH DIFFERENTIAL/PLATELET  URINALYSIS, ROUTINE W REFLEX MICROSCOPIC  TROPONIN I (HIGH SENSITIVITY)   ____________________________________________  EKG   EKG Interpretation Date/Time:  Monday September 27 2023 13:25:43 EDT Ventricular Rate:  92 PR Interval:  146 QRS Duration:  122 QT Interval:  362 QTC Calculation: 447 R Axis:   -17  Text Interpretation: Normal sinus rhythm with sinus arrhythmia Left bundle branch block Abnormal ECG When compared with ECG of 24-Sep-2023 13:39, PREVIOUS ECG IS PRESENT Confirmed by Alona Bene [phone]) on 09/27/2023 1:29:39 PM       ____________________________________________   PROCEDURES  Procedure(s) performed:   Procedures  None  ____________________________________________  INITIAL IMPRESSION / ASSESSMENT AND PLAN / ED COURSE  Pertinent labs & imaging results that were available during my care of the patient were reviewed by me and considered in my medical decision making (see chart for details).   This patient is Presenting for Evaluation of syncope, which does require a  range of treatment options, and is a complaint that involves a high risk of morbidity and mortality.  The Differential Diagnoses includes but is not exclusive to acute coronary syndrome, aortic dissection, pulmonary embolism, cardiac tamponade, community-acquired pneumonia, pericarditis, musculoskeletal chest wall pain, etc.   Critical Interventions-    Medications  sodium chloride 0.9 % bolus 500 mL (500 mLs Intravenous New Bag/Given 09/27/23 1406)    Reassessment after intervention:    Clinical Laboratory Tests Ordered, included CBC without leukocytosis or anemia.  No acute kidney injury.  LFTs, bilirubin, lipase normal. UA without infection.   Radiologic Tests: CXR and CT abdomen/pelvis pending.   Cardiac Monitor Tracing which shows NSR.   Medical Decision Making: Summary:    Reevaluation with update and discussion with   ***Considered admission***  Patient's presentation is most consistent with {EM COPA:27473}   Disposition:   ____________________________________________  FINAL CLINICAL IMPRESSION(S) / ED DIAGNOSES  Final diagnoses:  None     NEW OUTPATIENT MEDICATIONS STARTED DURING THIS VISIT:  New Prescriptions   No medications on file    Note:  This document was prepared using Dragon voice recognition software and may include unintentional dictation errors.  Alona Bene, MD, [facility] Emergency Medicine

## 2023-09-27 NOTE — ED Provider Notes (Addendum)
  Physical Exam  BP 131/71   Pulse 61   Temp 98.2 F (36.8 C) (Oral)   Resp 18   SpO2 98%   Physical Exam  Procedures  Procedures  ED Course / MDM    Medical Decision Making Amount and/or Complexity of Data Reviewed Labs: ordered. Radiology: ordered.  Risk Prescription drug management. Decision regarding hospitalization.   Kayla [name], assumed care for this patient.  In brief 73-year-old female with 2 episodes of syncope in the last 1 week.  Patient was signed out pending CT scan of the abdomen pelvis as the patient was having some generalized abdominal discomfort.  CT imaging negative.  Will admit patient for syncope.  Reassessment 6:55 PM-after discussing admission with the patient, she was initially agreeable to this, however after about 20 minutes she decided she did not want to be admitted to the hospital.  Patient understood that she was leaving AGAINST MEDICAL ADVICE, risks of leaving the hospital were discussed with the patient up to including death, or severe disability.  Patient has full capacity to make her medical decisions.  She understood that she could return to the emergency room at any time.  We wish her the best.       Anders Simmonds T, DO 09/27/23 1803    Anders Simmonds T, DO 09/27/23 1857

## 2023-10-26 ENCOUNTER — Ambulatory Visit: Admitting: Cardiology

## 2023-11-01 ENCOUNTER — Other Ambulatory Visit: Payer: Self-pay | Admitting: Internal Medicine

## 2023-11-01 DIAGNOSIS — Z1231 Encounter for screening mammogram for malignant neoplasm of breast: Secondary | ICD-10-CM

## 2023-12-06 ENCOUNTER — Ambulatory Visit

## 2023-12-06 VITALS — BP 124/84 | HR 79 | Ht 62.0 in | Wt 136.0 lb

## 2023-12-06 DIAGNOSIS — I340 Nonrheumatic mitral (valve) insufficiency: Secondary | ICD-10-CM | POA: Diagnosis not present

## 2023-12-06 DIAGNOSIS — I447 Left bundle-branch block, unspecified: Secondary | ICD-10-CM | POA: Diagnosis not present

## 2023-12-06 DIAGNOSIS — R55 Syncope and collapse: Secondary | ICD-10-CM

## 2023-12-06 NOTE — Assessment & Plan Note (Addendum)
 Appears to have been provoked in the setting of ongoing GI symptoms at the time and recovering with ongoing physical exertion. No syncopal episode.  Will proceed with 2-week heart monitor given left bundle branch morphology EKG and low normal EF on prior echocardiogram August 2018 to rule out any significant cardiac arrhythmias.  Will also obtain a transthoracic echocardiogram for any significant interval change in the function and mitral valve regurgitation.  She has symptoms of near syncope and lightheadedness with neck movement changes.  Will assess with ultrasound carotid to rule out any significant carotid artery disease.  Reassured her as there have been no significant symptoms or concerns over the past couple months.

## 2023-12-06 NOTE — Assessment & Plan Note (Signed)
 Known since at least 2018. No significant ischemia on prior stress test August 2018. Echocardiogram at the time with low normal EF 50 to 55%.  Will follow-up for any significant interval change in EF with a transthoracic echocardiogram.

## 2023-12-06 NOTE — Patient Instructions (Signed)
 Medication Instructions:  No changes *If you need a refill on your cardiac medications before your next appointment, please call your pharmacy*  Testing/Procedures: Your physician has requested that you have an echocardiogram. Echocardiography is a painless test that uses sound waves to create images of your heart. It provides your doctor with information about the size and shape of your heart and how well your heart's chambers and valves are working. This procedure takes approximately one hour. There are no restrictions for this procedure. Please do NOT wear cologne, perfume, aftershave, or lotions (deodorant is allowed). Please arrive 15 minutes prior to your appointment time.  Please note: We ask at that you not bring children with you during ultrasound (echo/ vascular) testing. Due to room size and safety concerns, children are not allowed in the ultrasound rooms during exams. Our front office staff cannot provide observation of children in our lobby area while testing is being conducted. An adult accompanying a patient to their appointment will only be allowed in the ultrasound room at the discretion of the ultrasound technician under special circumstances. We apologize for any inconvenience.   Your physician has requested that you have a carotid duplex. This test is an ultrasound of the carotid arteries in your neck. It looks at blood flow through these arteries that supply the brain with blood. Allow one hour for this exam. There are no restrictions or special instructions.   Your physician has recommended that you wear a 14 DAY ZIO-PATCH monitor. The Zio patch cardiac monitor continuously records heart rhythm data for up to 14 days, this is for patients being evaluated for multiple types heart rhythms. For the first 24 hours post application, please avoid getting the Zio monitor wet in the shower or by excessive sweating during exercise. After that, feel free to carry on with regular activities.  Keep soaps and lotions away from the ZIO XT Patch.  This will be mailed to you, please expect 7-10 days to receive.    Applying the monitor   Shave hair from upper left chest.   Hold abrader disc by orange tab.  Rub abrader in 40 strokes over left upper chest as indicated in your monitor instructions.   Clean area with 4 enclosed alcohol pads .  Use all pads to assure are is cleaned thoroughly.  Let dry.   Apply patch as indicated in monitor instructions.  Patch will be place under collarbone on left side of chest with arrow pointing upward.   Rub patch adhesive wings for 2 minutes.Remove white label marked 1.  Remove white label marked 2.  Rub patch adhesive wings for 2 additional minutes.   While looking in a mirror, press and release button in center of patch.  A small green light will flash 3-4 times .  This will be your only indicator the monitor has been turned on.     Do not shower for the first 24 hours.  You may shower after the first 24 hours.   Press button if you feel a symptom. You will hear a small click.  Record Date, Time and Symptom in the Patient Log Book.   When you are ready to remove patch, follow instructions on last 2 pages of Patient Log Book.  Stick patch monitor onto last page of Patient Log Book.   Place Patient Log Book in [place] box.  Use locking tab on box and tape box closed securely.  The [employer] and [employer] has [employer] on it.  Please  place in mailbox as soon as possible.  Your physician should have your test results approximately 7 days after the monitor has been mailed back to [facility].   Call [facility] Customer Care at [phone] if you have questions regarding your ZIO XT patch monitor.  Call them immediately if you see an orange light blinking on your monitor.   If your monitor falls off in less than 4 days contact our Monitor department at [phone].  If your monitor becomes loose or falls off after 4 days call [employer] at  [phone] for suggestions on securing your monitor   Follow-Up: At [facility], you and your health needs are our priority.  As part of our continuing mission to provide you with exceptional heart care, our providers are all part of one team.  This team includes your primary Cardiologist (physician) and Advanced Practice Providers or APPs (Physician Assistants and Nurse Practitioners) who all work together to provide you with the care you need, when you need it.  Your next appointment:   Follow up according to results of testing   Provider:   Bertha Broad, MD    We recommend signing up for the patient portal called MyChart.  Sign up information is provided on this After Visit Summary.  MyChart is used to connect with patients for Virtual Visits (Telemedicine).  Patients are able to view lab/test results, encounter notes, upcoming appointments, etc.  Non-urgent messages can be sent to your provider as well.   To learn more about what you can do with MyChart, go to [email or web address].

## 2023-12-06 NOTE — Progress Notes (Signed)
 Cardiology Consultation:    Date:  12/06/2023   ID:  Kayla [name], DOB May 18, 1951, MRN [id number]  PCP:  Kayla Reese., MD  Cardiologist:  Kayla Kells, MD   Referring MD: Kayla Balzarine, MD   No chief complaint on file.    ASSESSMENT AND PLAN:   Kayla [name] 73 year old woman with history of chronic left bundle branch block morphology on EKG at least identified since 2018, mild mitral regurgitation on echocardiogram in August 2018, no significant ischemia on prior stress test from August 2018, seen for evaluation of couple near syncopal episodes earlier in April that she recovered from GI symptoms attributed to norovirus infection.  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   Problem List Items Addressed This Visit     Left bundle branch block   Known since at least 2018. No significant ischemia on prior stress test August 2018. Echocardiogram at the time with low normal EF 50 to 55%.  Will follow-up for any significant interval change in EF with a transthoracic echocardiogram.      Near syncope - Primary   Appears to have been provoked in the setting of ongoing GI symptoms at the time and recovering with ongoing physical exertion. No syncopal episode.  Will proceed with 2-week heart monitor given left bundle branch morphology EKG and low normal EF on prior echocardiogram August 2018 to rule out any significant cardiac arrhythmias.  Will also obtain a transthoracic echocardiogram for any significant interval change in the function and mitral valve regurgitation.  She has symptoms of near syncope and lightheadedness with neck movement changes.  Will assess with ultrasound carotid to rule out any significant carotid artery disease.  Reassured her as there have been no significant symptoms or concerns over the past couple months.      Relevant Orders    ECHOCARDIOGRAM COMPLETE   VAS US  CAROTID   LONG TERM MONITOR (3-14 DAYS)   Nonrheumatic mitral valve regurgitation   Mild MR reported on prior echocardiogram August 2018.  Will follow-up with transthoracic echocardiogram as it has been 5 years.       Relevant Orders   ECHOCARDIOGRAM COMPLETE    Return to clinic based on test results. History of Present Illness:    Kayla [name] is a 73 y.o. female who is being seen today for the evaluation of syncope. Previously had a visit with Kayla [name].  Here with the visit today by herself.  Lives with her husband at home.  Is training at rec centers, doing aerobic activities and aquatic activities for seniors with [employer] programs.  Stays active.  Pleasant woman with history of chronic left bundle branch block [identified since 2018], mild mitral regurgitation on prior echocardiogram August 2018, stress test August 2018 without ischemia, no other significant chronic health issues.  Recently was down with stomach symptoms with diarrhea and nausea and vomiting for few days earlier in April and as she was recovering she resumed her activities as a Psychologist, educational, and on April 4 with symptoms of near syncope during her training session she was helped by coworkers.  Did not lose consciousness was brought to the ER by EMS.  Noted to have wide QRS rhythm but otherwise hemodynamically stable.  She had another episode that weekend on 09/27/2023 while at the church in the morning before having any breakfast, felt lightheaded as she was standing up with her arms elevated  and her husband helped her down and out of the chair symptoms subsided.  No loss of consciousness.  Since then she has been doing well no further episodes.  At times change of position and suddenly or turning her head 1 way or the other can cause slight lightheadedness for a brief moment.  Denies any syncopal or near syncopal episodes since April 7.  Denies any chest pain, shortness  of breath, orthopnea or paroxysmal nocturnal dyspnea. Does report bilateral lower extremity swelling which has been chronic and on exam today appears to be more consistent with lipedema.  Recent blood work from 10-19-2023 with BUN 17, creatinine 0.82, EGFR 77 Normal transaminases and alkaline phosphatase. Lipid panel total cholesterol 175, triglycerides 168, HDL 49, LDL 99. TSH normal 1.94. CBC unremarkable with hemoglobin 14.3 and hematocrit 41.7.  Has remote history of stress test August 2018 which showed no ischemia.  Echocardiogram from August 2018 noting normal biventricular function with mild LVH, EF 50 to 55%, mild mitral regurgitation  No past medical history on file.  Past Surgical History:  Procedure Laterality Date   APPENDECTOMY     age 65   FOOT SURGERY Bilateral    TONSILLECTOMY     age 73    Current Medications: Current Meds  Medication Sig   Ascorbic Acid (VITAMIN C PO) Take 1 capsule by mouth daily.   aspirin 81 MG tablet Take 81 mg by mouth daily.   B Complex Vitamins (VITAMIN-B COMPLEX) TABS Take 1 tablet by mouth daily.   Docusate Calcium (STOOL SOFTENER PO) Take 1 capsule by mouth daily.   fish oil-omega-3 fatty acids 1000 MG capsule Take 2 g by mouth daily.   MAGNESIUM PO Take 1 tablet by mouth daily at 6 (six) AM.   MULTIPLE VITAMIN PO Take 1 tablet by mouth daily.   PARoxetine (PAXIL) 10 MG tablet Take 10 mg by mouth daily.   Polyethylene Glycol POWD 1 g by Does not apply route daily.     Allergies:   Codeine and Gabapentin    Social History   Socioeconomic History   Marital status: Married    Spouse name: Not on file   Number of children: 2   Years of education: Not on file   Highest education level: Not on file  Occupational History   Occupation: Environmental consultant: [employer]  Tobacco Use   Smoking status: Never   Smokeless tobacco: Never  Vaping Use   Vaping status: Never Used  Substance and Sexual Activity   Alcohol  use: No   Drug use: No   Sexual activity: Not on file  Other Topics Concern   Not on file  Social History Narrative   Not on file   Social Drivers of Health   Financial Resource Strain: Not on file  Food Insecurity: Not on file  Transportation Needs: Not on file  Physical Activity: Not on file  Stress: Not on file  Social Connections: Not on file     Family History: The patient's family history includes Congestive Heart Failure in her father; Other in her mother. There is no history of Colon cancer. ROS:   Please see the history of present illness.    All 14 point review of systems negative except as described per history of present illness.  EKGs/Labs/Other Studies Reviewed:    The following studies were reviewed today:     EKG:       Recent Labs: 09/27/2023: ALT 35; BUN 15;  Creatinine, Ser 0.70; Hemoglobin 14.5; Platelets 224; Potassium 3.8; Sodium 139  Recent Lipid Panel No results found for: CHOL, TRIG, HDL, CHOLHDL, VLDL, LDLCALC, LDLDIRECT  Physical Exam:    VS:  BP 124/84   Pulse 79   Ht 5' 2 (1.575 m)   Wt 136 lb (61.7 kg)   SpO2 96%   BMI 24.87 kg/m     Wt Readings from Last 3 Encounters:  12/06/23 136 lb (61.7 kg)  09/24/23 134 lb 2.4 oz (60.9 kg)  02/17/23 134 lb (60.8 kg)     GENERAL:  Well nourished, well developed in no acute distress NECK: No JVD; No carotid bruits CARDIAC: RRR, S1 and S2 present, no murmurs, no rubs, no gallops CHEST:  Clear to auscultation without rales, wheezing or rhonchi  Extremities: No pitting pedal edema. Pulses bilaterally symmetric with radial 2+ and dorsalis pedis 2+ NEUROLOGIC:  Alert and oriented x 3  Medication Adjustments/Labs and Tests Ordered: Current medicines are reviewed at length with the patient today.  Concerns regarding medicines are outlined above.  Orders Placed This Encounter  Procedures   LONG TERM MONITOR (3-14 DAYS)   ECHOCARDIOGRAM COMPLETE   VAS US  CAROTID   No orders of  the defined types were placed in this encounter.   Signed, Lura Sallies, MD, MPH, [facility] 12/06/2023 3:45 PM    [facility]

## 2023-12-06 NOTE — Assessment & Plan Note (Signed)
 Mild MR reported on prior echocardiogram August 2018.  Will follow-up with transthoracic echocardiogram as it has been 5 years.

## 2023-12-15 ENCOUNTER — Ambulatory Visit
Admission: RE | Admit: 2023-12-15 | Discharge: 2023-12-15 | Disposition: A | Discharge status: Home / Self Care | Source: Ambulatory Visit | Attending: Internal Medicine | Admitting: Internal Medicine

## 2023-12-15 DIAGNOSIS — Z1231 Encounter for screening mammogram for malignant neoplasm of breast: Secondary | ICD-10-CM

## 2023-12-22 ENCOUNTER — Ambulatory Visit: Payer: Self-pay

## 2023-12-22 DIAGNOSIS — R55 Syncope and collapse: Secondary | ICD-10-CM

## 2023-12-22 NOTE — Progress Notes (Unsigned)
    [name] [name] [address] [place] [address] Phone: [phone]   Assessment and Plan:     There are no diagnoses linked to this encounter.  ***   Pertinent previous records reviewed include ***    Follow Up: ***     Subjective:   I, Jonas Goh, am serving as a Neurosurgeon for Doctor [name]  Chief Complaint: low back and left hip pain   HPI:   12/23/2023 Patient is a 73 year old female with low back and left hip pain. Patient states   Relevant Historical Information: ***  Additional pertinent review of systems negative.   Current Outpatient Medications:    Ascorbic Acid (VITAMIN C PO), Take 1 capsule by mouth daily., Disp: , Rfl:    aspirin 81 MG tablet, Take 81 mg by mouth daily., Disp: , Rfl:    B Complex Vitamins (VITAMIN-B COMPLEX) TABS, Take 1 tablet by mouth daily., Disp: , Rfl:    Docusate Calcium (STOOL SOFTENER PO), Take 1 capsule by mouth daily., Disp: , Rfl:    fish oil-omega-3 fatty acids 1000 MG capsule, Take 2 g by mouth daily., Disp: , Rfl:    fluticasone (FLONASE) 50 MCG/ACT nasal spray, Place into both nostrils daily. (Patient not taking: Reported on 12/06/2023), Disp: , Rfl:    MAGNESIUM PO, Take 1 tablet by mouth daily at 6 (six) AM., Disp: , Rfl:    methylPREDNISolone  (MEDROL  DOSEPAK) 4 MG TBPK tablet, Take 6 tablets on day 1.  Take 5 tablets on day 2.  Take 4 tablets on day 3.  Take 3 tablets on day 4.  Take 2 tablets on day 5.  Take 1 tablet on day 6. (Patient not taking: Reported on 12/06/2023), Disp: 21 tablet, Rfl: 0   MULTIPLE VITAMIN PO, Take 1 tablet by mouth daily., Disp: , Rfl:    PARoxetine (PAXIL) 10 MG tablet, Take 10 mg by mouth daily., Disp: , Rfl:    Polyethylene Glycol POWD, 1 g by Does not apply route daily., Disp: , Rfl:    potassium chloride  SA (KLOR-CON  M) 20 MEQ tablet, Take 2 tablets (40 mEq total) by mouth daily for 4 days. (Patient not taking: Reported on  12/06/2023), Disp: 8 tablet, Rfl: 0   Objective:     There were no vitals filed for this visit.    There is no height or weight on file to calculate BMI.    Physical Exam:    ***   Electronically signed by:  [name] [name] 2:46 PM 12/22/23

## 2023-12-23 ENCOUNTER — Ambulatory Visit: Admitting: Sports Medicine

## 2023-12-23 VITALS — BP 108/68 | HR 76 | Ht 62.0 in | Wt 135.0 lb

## 2023-12-23 DIAGNOSIS — M5136 Other intervertebral disc degeneration, lumbar region with discogenic back pain only: Secondary | ICD-10-CM | POA: Diagnosis not present

## 2023-12-23 DIAGNOSIS — G8929 Other chronic pain: Secondary | ICD-10-CM

## 2023-12-23 DIAGNOSIS — M545 Low back pain, unspecified: Secondary | ICD-10-CM

## 2023-12-23 DIAGNOSIS — M533 Sacrococcygeal disorders, not elsewhere classified: Secondary | ICD-10-CM | POA: Diagnosis not present

## 2023-12-23 MED ORDER — METHYLPREDNISOLONE 4 MG PO TBPK: ORAL_TABLET | ORAL | 0 refills | Status: AC

## 2023-12-23 NOTE — Patient Instructions (Signed)
 Prednisone dose pack. Low back and sacrum HEP Follow up with primary care for leg swelling. Follow up with us  in 3 weeks.

## 2024-01-11 NOTE — Progress Notes (Unsigned)
    Kayla [name] D.Kayla [name] [name] [address] [place] [address] Phone: [phone]   Assessment and Plan:     There are no diagnoses linked to this encounter.  ***   Pertinent previous records reviewed include ***    Follow Up: ***     Subjective:   I, Kayla [name], am serving as a Neurosurgeon for Doctor [name]   Chief Complaint: low back and left hip pain    HPI:    12/23/2023 Patient is a 73 year old female with low back and left hip pain. Patient states she bought new recliners and when she pushing the bottom have back she wrenched her back. This happened two weeks ago. Pain radiates down the legs mostly on the left side. She notes she has swelling in her legs. Tylenol  doesn't help when she takes. Tumeric and omega fatty acids.    01/12/2024 Patient states   Relevant Historical Information:  LBBB, factor V Leiden  Additional pertinent review of systems negative.   Current Outpatient Medications:    Ascorbic Acid (VITAMIN C PO), Take 1 capsule by mouth daily., Disp: , Rfl:    aspirin 81 MG tablet, Take 81 mg by mouth daily., Disp: , Rfl:    B Complex Vitamins (VITAMIN-B COMPLEX) TABS, Take 1 tablet by mouth daily., Disp: , Rfl:    Docusate Calcium (STOOL SOFTENER PO), Take 1 capsule by mouth daily., Disp: , Rfl:    fish oil-omega-3 fatty acids 1000 MG capsule, Take 2 g by mouth daily., Disp: , Rfl:    MAGNESIUM PO, Take 1 tablet by mouth daily at 6 (six) AM., Disp: , Rfl:    methylPREDNISolone  (MEDROL  DOSEPAK) 4 MG TBPK tablet, Follow instructions on the package., Disp: 21 tablet, Rfl: 0   MULTIPLE VITAMIN PO, Take 1 tablet by mouth daily., Disp: , Rfl:    PARoxetine (PAXIL) 10 MG tablet, Take 10 mg by mouth daily., Disp: , Rfl:    Polyethylene Glycol POWD, 1 g by Does not apply route daily., Disp: , Rfl:    Objective:     There were no vitals filed for this visit.    There is no height or weight on file to calculate BMI.     Physical Exam:    ***   Electronically signed by:  [name] D.Kayla [name] 9:46 AM 01/11/24

## 2024-01-12 ENCOUNTER — Ambulatory Visit: Admitting: Sports Medicine

## 2024-01-12 VITALS — HR 79 | Ht 62.0 in | Wt 135.0 lb

## 2024-01-12 DIAGNOSIS — G8929 Other chronic pain: Secondary | ICD-10-CM | POA: Diagnosis not present

## 2024-01-12 DIAGNOSIS — M5136 Other intervertebral disc degeneration, lumbar region with discogenic back pain only: Secondary | ICD-10-CM

## 2024-01-12 DIAGNOSIS — M533 Sacrococcygeal disorders, not elsewhere classified: Secondary | ICD-10-CM | POA: Diagnosis not present

## 2024-01-12 NOTE — Patient Instructions (Signed)
 Thank you for coming in today  Recommend continuing home exercise program focusing on hip, low back, especially gluteal musculature  Recommend using Tylenol  for day-to-day pain relief.  May continue turmeric for anti-inflammatory properties  May use topical Voltaren gel over areas of pain  Follow-up as needed if no improvement or worsening of symptoms.  We could further discuss injection therapy.

## 2024-01-17 ENCOUNTER — Ambulatory Visit (HOSPITAL_BASED_OUTPATIENT_CLINIC_OR_DEPARTMENT_OTHER)
Admission: RE | Admit: 2024-01-17 | Discharge: 2024-01-17 | Disposition: A | Discharge status: Home / Self Care | Source: Ambulatory Visit

## 2024-01-17 DIAGNOSIS — R55 Syncope and collapse: Secondary | ICD-10-CM | POA: Insufficient documentation

## 2024-01-17 DIAGNOSIS — I340 Nonrheumatic mitral (valve) insufficiency: Secondary | ICD-10-CM | POA: Diagnosis not present

## 2024-01-18 LAB — ECHOCARDIOGRAM COMPLETE
AR max vel: 2.23 cm2
AV Area VTI: 2.26 cm2
AV Area mean vel: 2.23 cm2
AV Mean grad: 4.7 mmHg
AV Peak grad: 7.9 mmHg
Ao pk vel: 1.4 m/s
Area-P 1/2: 3.08 cm2
Calc EF: 62.6 %
MV M vel: 3.74 m/s
MV Peak grad: 56 mmHg
S' Lateral: 2.8 cm
Single Plane A2C EF: 61.2 %
Single Plane A4C EF: 60.8 %

## 2024-01-31 ENCOUNTER — Ambulatory Visit (INDEPENDENT_AMBULATORY_CARE_PROVIDER_SITE_OTHER)

## 2024-01-31 ENCOUNTER — Ambulatory Visit: Admitting: Sports Medicine

## 2024-01-31 VITALS — HR 90 | Ht 62.0 in | Wt 135.0 lb

## 2024-01-31 DIAGNOSIS — M25511 Pain in right shoulder: Secondary | ICD-10-CM | POA: Diagnosis not present

## 2024-01-31 DIAGNOSIS — M67911 Unspecified disorder of synovium and tendon, right shoulder: Secondary | ICD-10-CM

## 2024-01-31 NOTE — Progress Notes (Signed)
 Kayla [name] D.[name] [address] [place] [address] Phone: [phone]   Assessment and Plan:     1. Acute pain of right shoulder 2. Tendinopathy of rotator cuff, right  -Acute, initial visit - Most consistent with rotator cuff strain x 5 days occurring while lifting heavy object - Do not recommend NSAIDs with past medical history of factor V Leiden - X-ray obtained in clinic.  My interpretation: No acute fracture or dislocation - Patient elected for subacromial CSI.  Tolerated well per note below - Use Tylenol  500 to 1000 mg tablets 2-3 times a day for day-to-day pain relief - Start HEP to improve range of motion  Procedure: Subacromial Injection Side: Right  Risks explained and consent was given verbally. The site was cleaned with alcohol prep. A steroid injection was performed from posterior approach using 2mL of 1% lidocaine without epinephrine and 1mL of kenalog  40mg /ml. This was well tolerated.  Needle was removed, hemostasis achieved, and post injection instructions were explained.   Pt was advised to call or return to clinic if these symptoms worsen or fail to improve as anticipated.   15 additional minutes spent for educating Therapeutic Home Exercise Program.  This included exercises focusing on stretching, strengthening, with focus on eccentric aspects.   Long term goals include an improvement in range of motion, strength, endurance as well as avoiding reinjury. Patient's frequency would include in 1-2 times a day, 3-5 times a week for a duration of 6-12 weeks. Proper technique shown and discussed handout in great detail with ATC.  All questions were discussed and answered.    Pertinent previous records reviewed include none  Follow Up: 3 to 4 weeks for reevaluation.  Could consider physical therapy versus ultrasound   Subjective:   I, Kayla [name], am serving as a Neurosurgeon for Kayla [name]  Chief Complaint:  right shoulder pain   HPI:   01/31/24 Patient is a 73 year old female with right shoulder pain. Patient states she was lifting the lid of her hot tub and she flared her right shoulder last Wednesday. Decreased ROM. Tylenol  doesn't help much with pain. Has used icy hot and Voltaren and that has helped with the pain. Pain radiates down to her bicep.    Relevant Historical Information: LBBB, factor V Leiden  Additional pertinent review of systems negative.   Current Outpatient Medications:    Ascorbic Acid (VITAMIN C PO), Take 1 capsule by mouth daily., Disp: , Rfl:    aspirin 81 MG tablet, Take 81 mg by mouth daily., Disp: , Rfl:    B Complex Vitamins (VITAMIN-B COMPLEX) TABS, Take 1 tablet by mouth daily., Disp: , Rfl:    Docusate Calcium (STOOL SOFTENER PO), Take 1 capsule by mouth daily., Disp: , Rfl:    fish oil-omega-3 fatty acids 1000 MG capsule, Take 2 g by mouth daily., Disp: , Rfl:    MAGNESIUM PO, Take 1 tablet by mouth daily at 6 (six) AM., Disp: , Rfl:    methylPREDNISolone  (MEDROL  DOSEPAK) 4 MG TBPK tablet, Follow instructions on the package., Disp: 21 tablet, Rfl: 0   MULTIPLE VITAMIN PO, Take 1 tablet by mouth daily., Disp: , Rfl:    PARoxetine (PAXIL) 10 MG tablet, Take 10 mg by mouth daily., Disp: , Rfl:    Polyethylene Glycol POWD, 1 g by Does not apply route daily., Disp: , Rfl:    Objective:     Vitals:   01/31/24 1543  Pulse: 90  SpO2: 97%  Weight: 135 lb (61.2 kg)  Height: 5' 2 (1.575 m)      Body mass index is 24.69 kg/m.    Physical Exam:    Gen: Appears well, nad, nontoxic and pleasant Neuro:sensation intact, strength is 5/5 with df/pf/inv/ev, muscle tone wnl Skin: no suspicious lesion or defmority Psych: A&O, appropriate mood and affect  Right shoulder:  No deformity, swelling or muscle wasting No scapular winging FF 150, abd 130, int 20, ext 80 NTTP over the [place], clavicle, ac, coracoid, biceps groove, humerus, deltoid, trapezius, cervical  spine Positive neer, hawkins, empty can, obriens, crossarm, subscap liftoff, Negative speeds Neg ant drawer, sulcus sign, apprehension Negative Spurling's test bilat FROM of neck    Electronically signed by:  [name] D.[name] 4:10 PM 01/31/24

## 2024-01-31 NOTE — Patient Instructions (Addendum)
 Shoulder HEP  Tylenol  [phone] mg 2-3 times a day for pain relief  No upper extremity weights  3- 4 week follow up

## 2024-02-10 ENCOUNTER — Ambulatory Visit: Admitting: Sports Medicine

## 2024-02-11 ENCOUNTER — Ambulatory Visit: Payer: Self-pay | Admitting: Sports Medicine

## 2024-02-24 ENCOUNTER — Ambulatory Visit: Admitting: Sports Medicine

## 2024-02-28 ENCOUNTER — Ambulatory Visit: Admitting: Sports Medicine

## 2024-03-28 ENCOUNTER — Encounter (INDEPENDENT_AMBULATORY_CARE_PROVIDER_SITE_OTHER): Payer: PPO | Admitting: Ophthalmology

## 2024-03-28 DIAGNOSIS — H348322 Tributary (branch) retinal vein occlusion, left eye, stable: Secondary | ICD-10-CM

## 2024-03-28 DIAGNOSIS — H2513 Age-related nuclear cataract, bilateral: Secondary | ICD-10-CM | POA: Diagnosis not present

## 2024-03-28 DIAGNOSIS — H33302 Unspecified retinal break, left eye: Secondary | ICD-10-CM

## 2024-03-28 DIAGNOSIS — H43813 Vitreous degeneration, bilateral: Secondary | ICD-10-CM | POA: Diagnosis not present

## 2024-06-26 NOTE — Progress Notes (Addendum)
"             ° °   Ben Matalynn Graff [name] [address] [place] [address] Phone: [phone]   Assessment and Plan:     1. Primary osteoarthritis of left knee (Primary) 2. Chronic pain of left knee -Chronic with exacerbation, subsequent visit - Recurrence of left knee pain consistent with flare of osteoarthritis. - Patient had significant improvement after PRP injection on 01/11/2023.  Elected for repeat PRP injection today.  Tolerated well per note below - Continue HEP and physical activity as tolerated - Use Tylenol  500 to 1000 mg tablets 2-3 times a day for day-to-day pain relief     Procedure: Knee Joint Injection Side: Left Indication: Chronic left knee pain   Risks explained and consent was given verbally. The site was cleaned with alcohol prep. A needle was introduced with an anterio-lateral approach. Injection given using 5.5mL of PRP. This was well tolerated and resulted in symptomatic relief.  Needle was removed, hemostasis achieved, and post injection instructions were explained.   Pt was advised to call or return to clinic if these symptoms worsen or fail to improve as anticipated.    Pertinent previous records reviewed include none   Pertinent previous records reviewed include none   Follow Up: 6 weeks for reevaluation.  Could consider repeat PRP injection in the future versus CSI versus HA injection versus repeat MRI/orthopedic surgery referral   Subjective:   I, [name], am serving as a neurosurgeon for Doctor [name]   Chief Complaint: right shoulder pain    HPI:    01/31/24 Patient is a 74 year old female with right shoulder pain. Patient states she was lifting the lid of her hot tub and she flared her right shoulder last Wednesday. Decreased ROM. Tylenol  doesn't help much with pain. Has used icy hot and Voltaren and that has helped with the pain. Pain radiates down to her bicep.    06/27/2024 Patient states PRP    Relevant Historical Information: LBBB, factor V Leiden  Additional pertinent review of systems negative.  Current Medications[1]       Electronically signed by:  [name] [name] 2:15 PM 06/27/2024     [1]  Current Outpatient Medications:    Ascorbic Acid (VITAMIN C PO), Take 1 capsule by mouth daily., Disp: , Rfl:    aspirin 81 MG tablet, Take 81 mg by mouth daily., Disp: , Rfl:    B Complex Vitamins (VITAMIN-B COMPLEX) TABS, Take 1 tablet by mouth daily., Disp: , Rfl:    Docusate Calcium (STOOL SOFTENER PO), Take 1 capsule by mouth daily., Disp: , Rfl:    fish oil-omega-3 fatty acids 1000 MG capsule, Take 2 g by mouth daily., Disp: , Rfl:    MAGNESIUM PO, Take 1 tablet by mouth daily at 6 (six) AM., Disp: , Rfl:    methylPREDNISolone  (MEDROL  DOSEPAK) 4 MG TBPK tablet, Follow instructions on the package., Disp: 21 tablet, Rfl: 0   MULTIPLE VITAMIN PO, Take 1 tablet by mouth daily., Disp: , Rfl:    PARoxetine (PAXIL) 10 MG tablet, Take 10 mg by mouth daily., Disp: , Rfl:    Polyethylene Glycol POWD, 1 g by Does not apply route daily., Disp: , Rfl:   "

## 2024-06-27 ENCOUNTER — Ambulatory Visit: Payer: Self-pay | Admitting: Sports Medicine

## 2024-06-27 DIAGNOSIS — G8929 Other chronic pain: Secondary | ICD-10-CM

## 2024-06-27 DIAGNOSIS — M25562 Pain in left knee: Secondary | ICD-10-CM

## 2024-06-27 DIAGNOSIS — M1712 Unilateral primary osteoarthritis, left knee: Secondary | ICD-10-CM

## 2024-06-27 NOTE — Patient Instructions (Signed)
 Tylenol  [phone] mg 2-3 times a day for pain relief   6 week follow up

## 2024-08-10 ENCOUNTER — Ambulatory Visit: Admitting: Sports Medicine

## 2024-12-27 ENCOUNTER — Encounter (INDEPENDENT_AMBULATORY_CARE_PROVIDER_SITE_OTHER): Admitting: Ophthalmology
